# Patient Record
Sex: Male | Born: 1965 | Race: White | Hispanic: No | Marital: Married | Smoking: Never smoker
Health system: Southern US, Community
[De-identification: ages and names within clinical notes are randomized; demographics above are authoritative.]

## PROBLEM LIST (undated history)

## (undated) DIAGNOSIS — I10 Essential (primary) hypertension: Secondary | ICD-10-CM

## (undated) DIAGNOSIS — E119 Type 2 diabetes mellitus without complications: Secondary | ICD-10-CM

## (undated) DIAGNOSIS — Z87442 Personal history of urinary calculi: Secondary | ICD-10-CM

## (undated) DIAGNOSIS — J189 Pneumonia, unspecified organism: Secondary | ICD-10-CM

## (undated) DIAGNOSIS — K219 Gastro-esophageal reflux disease without esophagitis: Secondary | ICD-10-CM

## (undated) HISTORY — PX: TONSILLECTOMY: SUR1361

## (undated) HISTORY — PX: EYE SURGERY: SHX253

---

## 2008-08-29 ENCOUNTER — Ambulatory Visit: Payer: Self-pay | Admitting: Family Medicine

## 2010-03-12 IMAGING — US ABDOMEN ULTRASOUND
1 series · 17 of 25 positions shown · non-contrast
Comparison: none

REASON FOR EXAM: abnormal LFT   eval fatty liver
COMMENTS:

[Series 1: abdomen ultrasound · 17 of 67 slices shown]
[im 1/67]
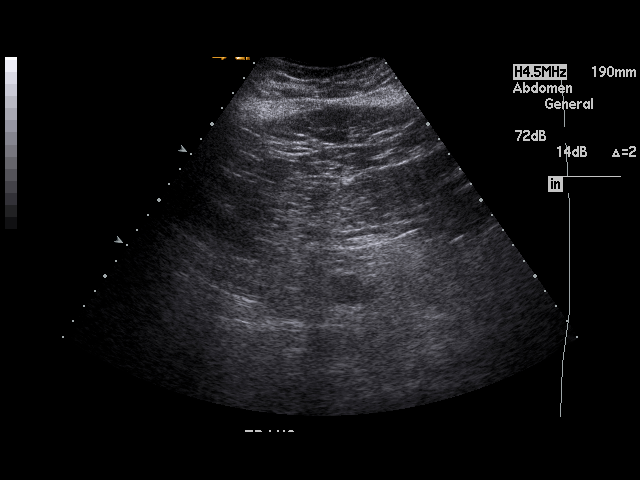
[im 6/67]
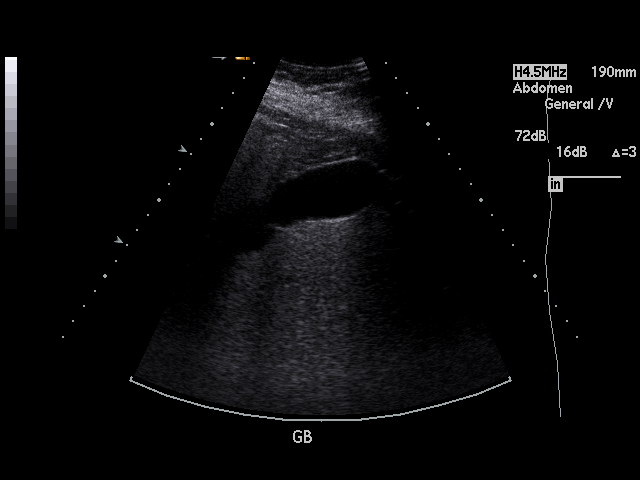
[im 9/67]
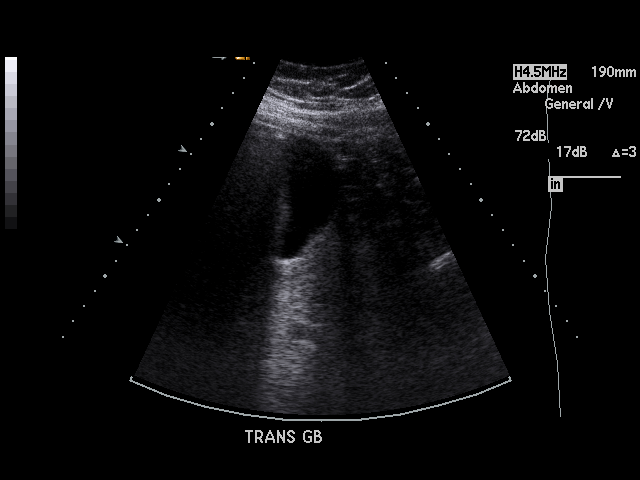
[im 14/67]
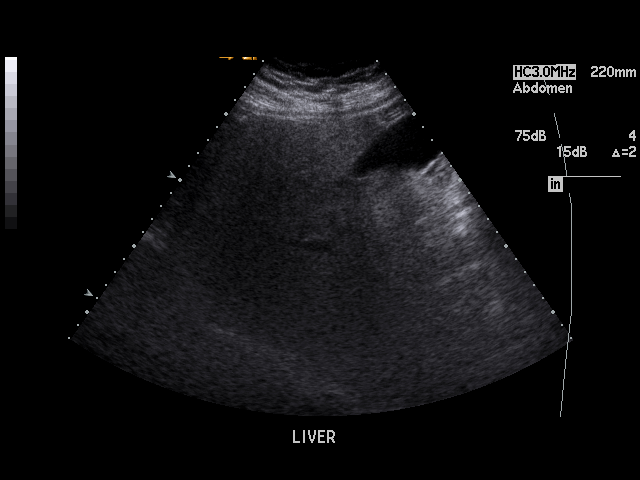
[im 17/67]
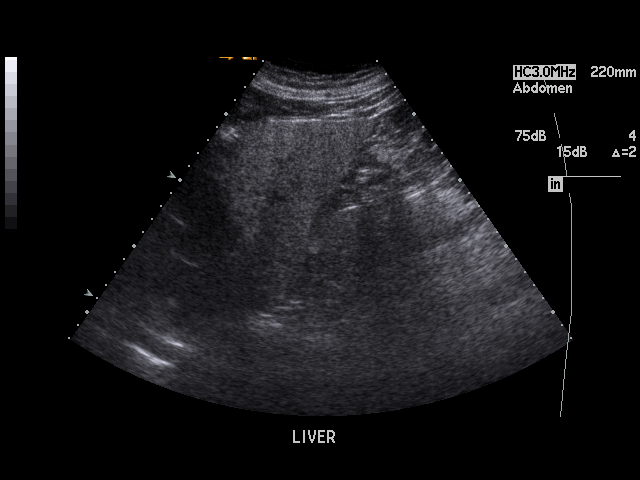
[im 23/67]
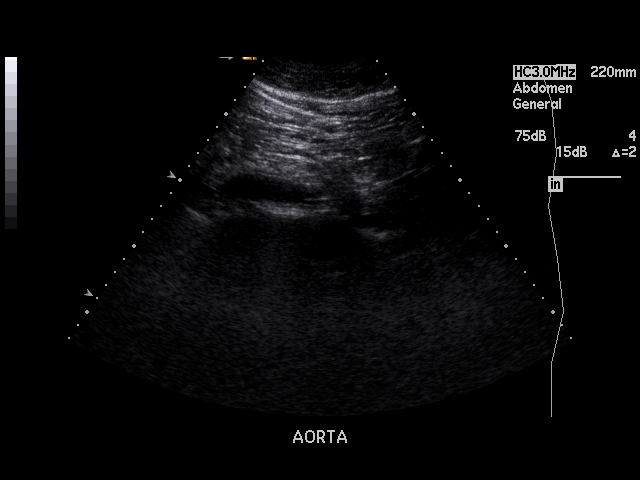
[im 25/67]
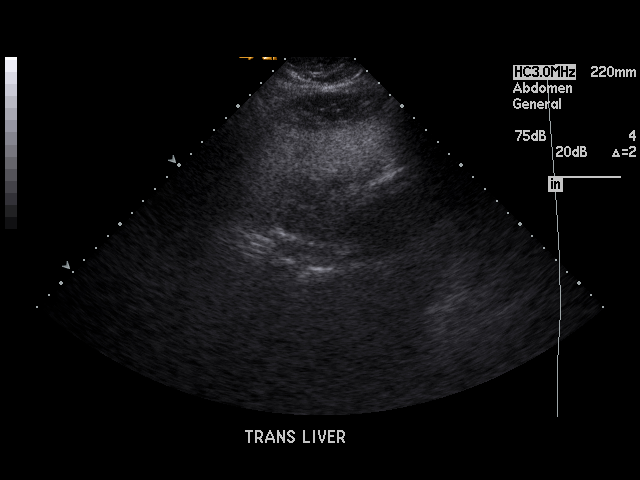
[im 31/67]
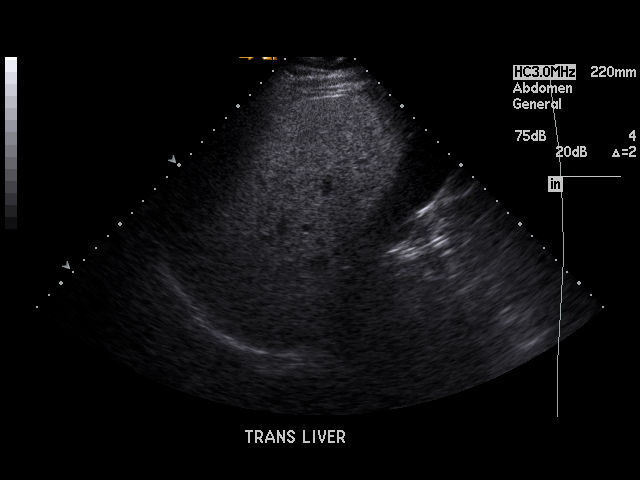
[im 34/67]
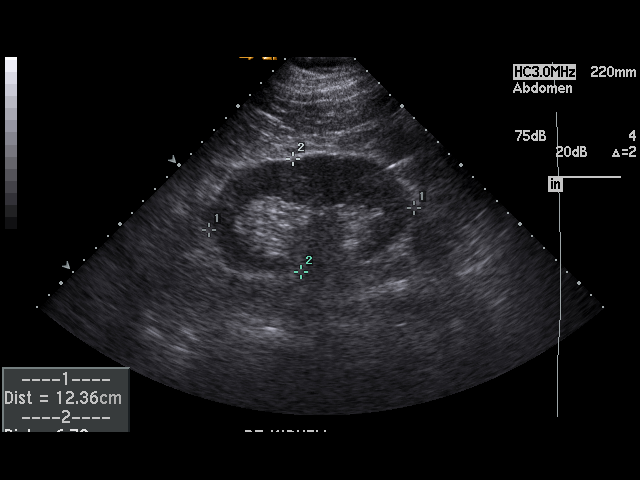
[im 36/67]
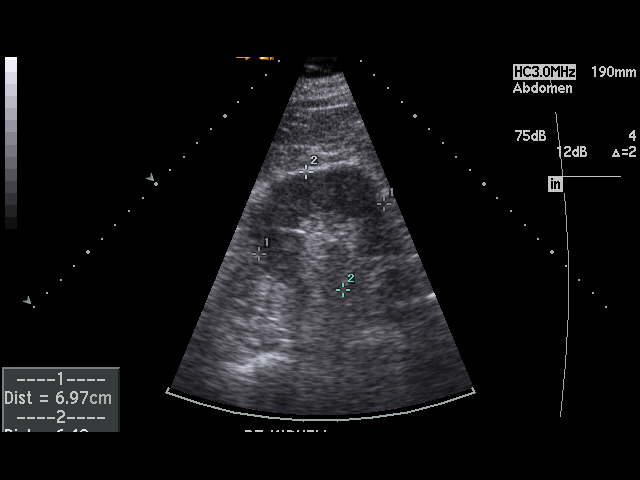
[im 42/67]
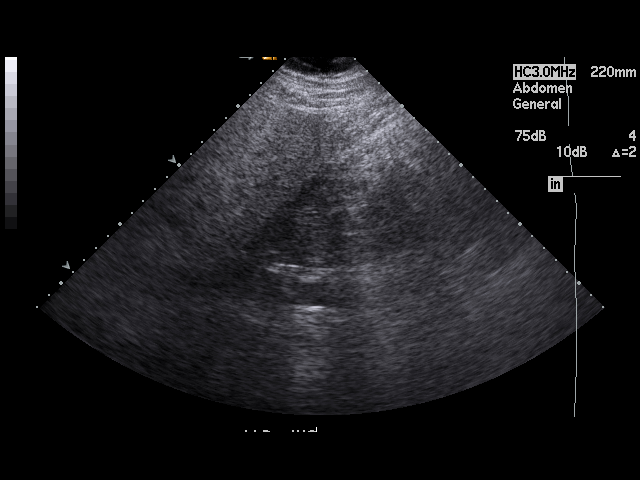
[im 45/67]
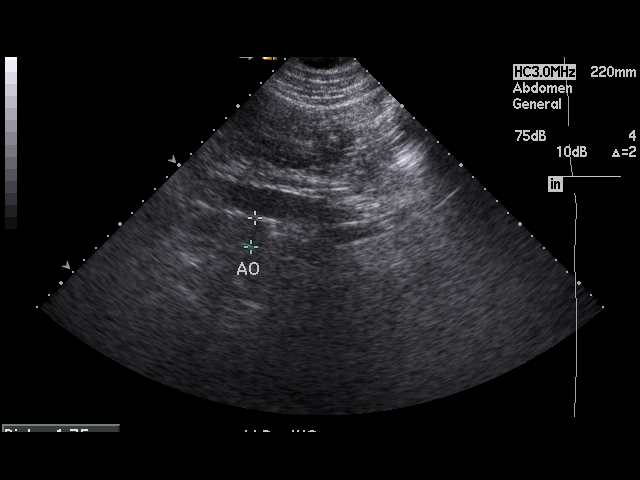
[im 50/67]
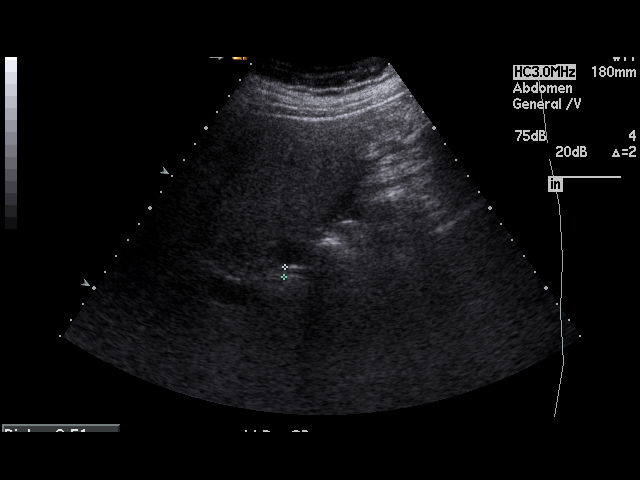
[im 53/67]
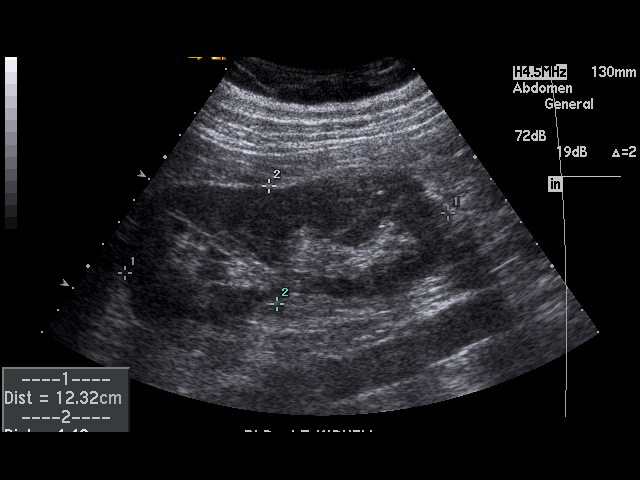
[im 58/67]
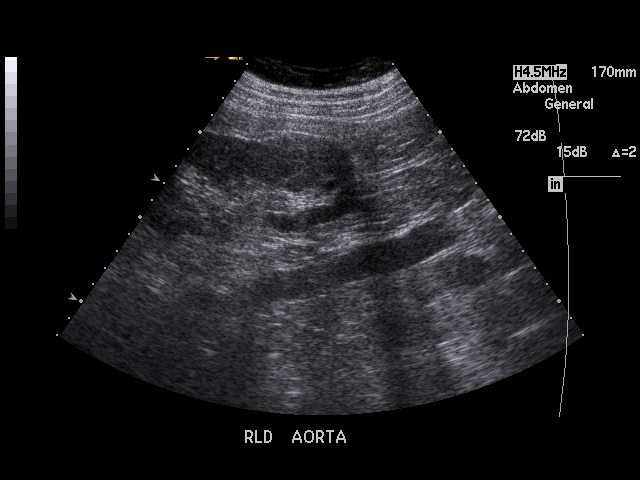
[im 61/67]
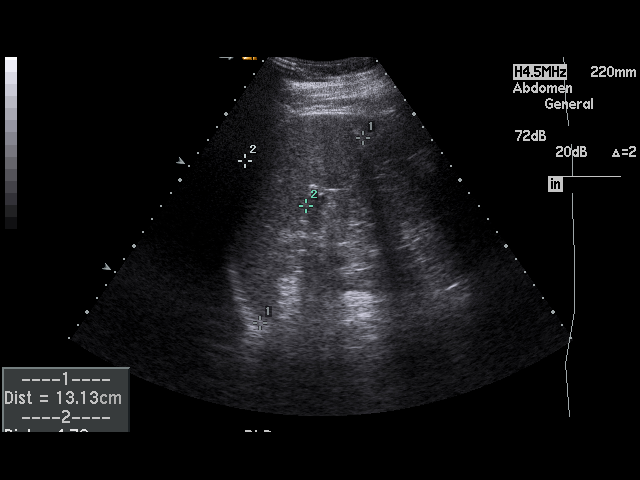
[im 67/67]
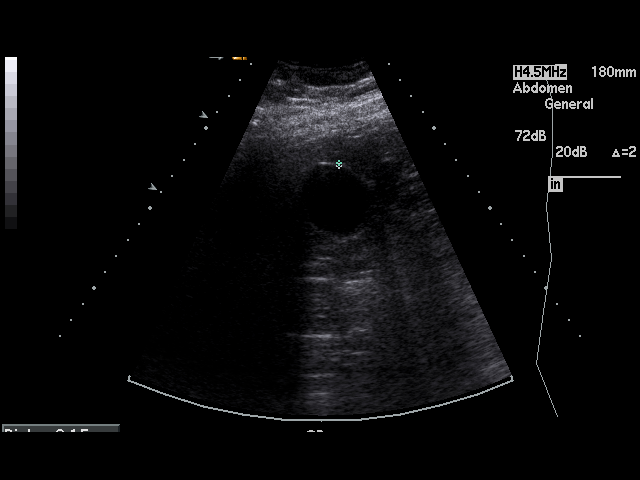

[17 of 25 positions shown; findings below may reference images not displayed]

PROCEDURE:     US  - US ABDOMEN GENERAL SURVEY  - August 29, 2008  [DATE]

RESULT:     The liver is dense, suspicious for fatty infiltration. No focal
hepatic mass lesions are seen. The spleen is upper limits to normal in size.
The spleen measures 13.1 cm at maximum AP diameter. The pancreas is not
visualized adequately for evaluation. The abdominal aorta and inferior vena
cava show no significant abnormalities. No gallstones are seen. There is no
thickening of the gallbladder wall. The common bile duct measures 5.1 mm in
diameter which is within normal limits. The kidneys show no hydronephrosis.
There is no ascites.
IMPRESSION: 1. No gallstones are seen.
2. Probable fatty infiltration of the liver.
3. The spleen is upper limits to normal in size.
4. The pancreas is not visualized adequate for evaluation.

## 2015-06-13 ENCOUNTER — Encounter: Payer: Self-pay | Admitting: *Deleted

## 2015-06-13 DIAGNOSIS — E669 Obesity, unspecified: Secondary | ICD-10-CM | POA: Diagnosis not present

## 2015-06-13 DIAGNOSIS — Z6837 Body mass index (BMI) 37.0-37.9, adult: Secondary | ICD-10-CM | POA: Diagnosis not present

## 2015-06-13 DIAGNOSIS — E119 Type 2 diabetes mellitus without complications: Secondary | ICD-10-CM | POA: Diagnosis not present

## 2015-06-13 DIAGNOSIS — H2511 Age-related nuclear cataract, right eye: Secondary | ICD-10-CM | POA: Diagnosis not present

## 2015-06-13 DIAGNOSIS — H269 Unspecified cataract: Secondary | ICD-10-CM | POA: Diagnosis present

## 2015-06-13 DIAGNOSIS — I10 Essential (primary) hypertension: Secondary | ICD-10-CM | POA: Diagnosis not present

## 2015-06-13 DIAGNOSIS — E780 Pure hypercholesterolemia: Secondary | ICD-10-CM | POA: Diagnosis not present

## 2015-06-19 ENCOUNTER — Ambulatory Visit: Payer: Managed Care, Other (non HMO) | Admitting: Certified Registered Nurse Anesthetist

## 2015-06-19 ENCOUNTER — Ambulatory Visit
Admission: RE | Admit: 2015-06-19 | Discharge: 2015-06-19 | Disposition: A | Discharge status: Home / Self Care | Payer: Managed Care, Other (non HMO) | Source: Ambulatory Visit | Attending: Ophthalmology | Admitting: Ophthalmology

## 2015-06-19 ENCOUNTER — Encounter
Admission: RE | Disposition: A | Discharge status: Home / Self Care | Payer: Self-pay | Source: Ambulatory Visit | Attending: Ophthalmology

## 2015-06-19 ENCOUNTER — Encounter: Payer: Self-pay | Admitting: *Deleted

## 2015-06-19 DIAGNOSIS — Z6837 Body mass index (BMI) 37.0-37.9, adult: Secondary | ICD-10-CM | POA: Insufficient documentation

## 2015-06-19 DIAGNOSIS — I10 Essential (primary) hypertension: Secondary | ICD-10-CM | POA: Insufficient documentation

## 2015-06-19 DIAGNOSIS — E780 Pure hypercholesterolemia: Secondary | ICD-10-CM | POA: Insufficient documentation

## 2015-06-19 DIAGNOSIS — E119 Type 2 diabetes mellitus without complications: Secondary | ICD-10-CM | POA: Insufficient documentation

## 2015-06-19 DIAGNOSIS — H2511 Age-related nuclear cataract, right eye: Secondary | ICD-10-CM | POA: Insufficient documentation

## 2015-06-19 DIAGNOSIS — E669 Obesity, unspecified: Secondary | ICD-10-CM | POA: Insufficient documentation

## 2015-06-19 HISTORY — DX: Essential (primary) hypertension: I10

## 2015-06-19 HISTORY — PX: CATARACT EXTRACTION W/PHACO: SHX586

## 2015-06-19 HISTORY — DX: Type 2 diabetes mellitus without complications: E11.9

## 2015-06-19 LAB — POTASSIUM: POTASSIUM: 3.5 mmol/L (ref 3.5–5.1)

## 2015-06-19 LAB — GLUCOSE, CAPILLARY: Glucose-Capillary: 203 mg/dL — ABNORMAL HIGH (ref 65–99)

## 2015-06-19 SURGERY — PHACOEMULSIFICATION, CATARACT, WITH IOL INSERTION
Anesthesia: Monitor Anesthesia Care | Site: Eye | Laterality: Right | Wound class: Clean

## 2015-06-19 MED ORDER — CYCLOPENTOLATE HCL 2 % OP SOLN
1.0000 [drp] | OPHTHALMIC | Status: AC | PRN
Start: 2015-06-19 — End: 2015-06-19
  Administered 2015-06-19 (×4): 1 [drp] via OPHTHALMIC

## 2015-06-19 MED ORDER — MOXIFLOXACIN HCL 0.5 % OP SOLN
OPHTHALMIC | Status: DC | PRN
Start: 2015-06-19 — End: 2015-06-19
  Administered 2015-06-19: 1 [drp] via OPHTHALMIC

## 2015-06-19 MED ORDER — CARBACHOL 0.01 % IO SOLN
INTRAOCULAR | Status: DC | PRN
  Administered 2015-06-19: 0.5 mL via INTRAOCULAR

## 2015-06-19 MED ORDER — MIDAZOLAM HCL 2 MG/2ML IJ SOLN
INTRAMUSCULAR | Status: DC | PRN
  Administered 2015-06-19 (×2): 1 mg via INTRAVENOUS

## 2015-06-19 MED ORDER — CEFUROXIME OPHTHALMIC INJECTION 1 MG/0.1 ML
INJECTION | OPHTHALMIC | Status: DC | PRN
  Administered 2015-06-19: 0.1 mL via INTRACAMERAL

## 2015-06-19 MED ORDER — NA CHONDROIT SULF-NA HYALURON 40-17 MG/ML IO SOLN
INTRAOCULAR | Status: DC | PRN
Start: 2015-06-19 — End: 2015-06-19
  Administered 2015-06-19: 1 mL via INTRAOCULAR

## 2015-06-19 MED ORDER — TETRACAINE HCL 0.5 % OP SOLN
OPHTHALMIC | Status: DC | PRN
  Administered 2015-06-19: 2 [drp] via OPHTHALMIC

## 2015-06-19 MED ORDER — PHENYLEPHRINE HCL 10 % OP SOLN
1.0000 [drp] | OPHTHALMIC | Status: AC | PRN
  Administered 2015-06-19 (×4): 1 [drp] via OPHTHALMIC

## 2015-06-19 MED ORDER — MOXIFLOXACIN HCL 0.5 % OP SOLN
1.0000 [drp] | OPHTHALMIC | Status: AC | PRN
  Administered 2015-06-19 (×3): 1 [drp] via OPHTHALMIC

## 2015-06-19 MED ORDER — SODIUM CHLORIDE 0.9 % IV SOLN
INTRAVENOUS | Status: DC
Start: 2015-06-19 — End: 2015-06-19
  Administered 2015-06-19: 06:00:00 via INTRAVENOUS

## 2015-06-19 MED ORDER — ALFENTANIL 500 MCG/ML IJ INJ
INJECTION | INTRAMUSCULAR | Status: DC | PRN
  Administered 2015-06-19 (×2): 250 ug via INTRAVENOUS

## 2015-06-19 MED ORDER — EPINEPHRINE HCL 1 MG/ML IJ SOLN
INTRAMUSCULAR | Status: DC | PRN
  Administered 2015-06-19: 200 mL via OPHTHALMIC

## 2015-06-19 MED ORDER — LIDOCAINE HCL (PF) 4 % IJ SOLN
INTRAMUSCULAR | Status: DC | PRN
  Administered 2015-06-19: 4 mL via OPHTHALMIC

## 2015-06-19 MED ORDER — EPINEPHRINE HCL 1 MG/ML IJ SOLN
INTRAMUSCULAR | Status: DC | PRN
  Administered 2015-06-19: 4 mL via OPHTHALMIC

## 2015-06-19 SURGICAL SUPPLY — 32 items
ACRYsofIQ ×3 IMPLANT
CANNULA ANT/CHMB 27GA (MISCELLANEOUS) ×3 IMPLANT
CORD BIP STRL DISP 12FT (MISCELLANEOUS) ×3 IMPLANT
CUP MEDICINE 2OZ PLAST GRAD ST (MISCELLANEOUS) ×3 IMPLANT
DRAPE XRAY CASSETTE 23X24 (DRAPES) ×3 IMPLANT
ERASER HMR WETFIELD 18G (MISCELLANEOUS) ×3 IMPLANT
GLOVE BIO SURGEON STRL SZ8 (GLOVE) ×3 IMPLANT
GLOVE SURG LX 6.5 MICRO (GLOVE) ×2
GLOVE SURG LX 8.0 MICRO (GLOVE) ×2
GLOVE SURG LX STRL 6.5 MICRO (GLOVE) ×1 IMPLANT
GLOVE SURG LX STRL 8.0 MICRO (GLOVE) ×1 IMPLANT
GOWN STRL REUS W/ TWL LRG LVL3 (GOWN DISPOSABLE) ×1 IMPLANT
GOWN STRL REUS W/ TWL XL LVL3 (GOWN DISPOSABLE) ×1 IMPLANT
GOWN STRL REUS W/TWL LRG LVL3 (GOWN DISPOSABLE) ×2
GOWN STRL REUS W/TWL XL LVL3 (GOWN DISPOSABLE) ×2
LENS IOL ACRSF IQ TRC 5 18.5 (Intraocular Lens) ×1 IMPLANT
LENS IOL ACRYSOF IQ TORIC 18.5 (Intraocular Lens) ×2 IMPLANT
LENS IOL IQ TORIC 5 18.5 (Intraocular Lens) ×1 IMPLANT
PACK CATARACT (MISCELLANEOUS) ×3 IMPLANT
PACK CATARACT DINGLEDEIN LX (MISCELLANEOUS) ×3 IMPLANT
PACK EYE AFTER SURG (MISCELLANEOUS) ×3 IMPLANT
SHLD EYE VISITEC  UNIV (MISCELLANEOUS) ×3 IMPLANT
SOL BSS BAG (MISCELLANEOUS) ×3
SOL PREP PVP 2OZ (MISCELLANEOUS) ×3
SOLUTION BSS BAG (MISCELLANEOUS) ×1 IMPLANT
SOLUTION PREP PVP 2OZ (MISCELLANEOUS) ×1 IMPLANT
SUT SILK 5-0 (SUTURE) ×3 IMPLANT
SYR 3ML LL SCALE MARK (SYRINGE) ×3 IMPLANT
SYR 5ML LL (SYRINGE) ×3 IMPLANT
SYR TB 1ML 27GX1/2 LL (SYRINGE) ×3 IMPLANT
WATER STERILE IRR 1000ML POUR (IV SOLUTION) ×3 IMPLANT
WIPE NON LINTING 3.25X3.25 (MISCELLANEOUS) ×3 IMPLANT

## 2015-06-19 NOTE — Anesthesia Procedure Notes (Signed)
Procedure Name: MAC Performed by: Kamden Stanislaw Pre-anesthesia Checklist: Patient identified, Emergency Drugs available, Suction available, Patient being monitored and Timeout performed Oxygen Delivery Method: Nasal cannula       

## 2015-06-19 NOTE — Anesthesia Preprocedure Evaluation (Signed)
Anesthesia Evaluation  Patient identified by MRN, date of birth, ID band Patient awake    Reviewed: Allergy & Precautions, NPO status , Patient's Chart, lab work & pertinent test results  Airway Mallampati: II  TM Distance: >3 FB Neck ROM: Full    Dental  (+) Teeth Intact   Pulmonary    Pulmonary exam normal       Cardiovascular Exercise Tolerance: Good hypertension, Pt. on medications Normal cardiovascular exam    Neuro/Psych    GI/Hepatic   Endo/Other  diabetes, Poorly Controlled, Type 2  Renal/GU      Musculoskeletal   Abdominal (+) + obese,   Peds  Hematology   Anesthesia Other Findings   Reproductive/Obstetrics                             Anesthesia Physical Anesthesia Plan  ASA: III  Anesthesia Plan: MAC   Post-op Pain Management:    Induction: Intravenous  Airway Management Planned: Nasal Cannula  Additional Equipment:   Intra-op Plan:   Post-operative Plan:   Informed Consent: I have reviewed the patients History and Physical, chart, labs and discussed the procedure including the risks, benefits and alternatives for the proposed anesthesia with the patient or authorized representative who has indicated his/her understanding and acceptance.     Plan Discussed with: CRNA  Anesthesia Plan Comments:         Anesthesia Quick Evaluation

## 2015-06-19 NOTE — Discharge Instructions (Signed)
AMBULATORY SURGERY  DISCHARGE INSTRUCTIONS   1) The drugs that you were given will stay in your system until tomorrow so for the next 24 hours you should not:  A) Drive an automobile B) Make any legal decisions C) Drink any alcoholic beverage   2) You may resume regular meals tomorrow.  Today it is better to start with liquids and gradually work up to solid foods.  You may eat anything you prefer, but it is better to start with liquids, then soup and crackers, and gradually work up to solid foods.   3) Please notify your doctor immediately if you have any unusual bleeding, trouble breathing, redness and pain at the surgery site, drainage, fever, or pain not relieved by medication.    4) Additional Instructions:        Please contact your physician with any problems or Same Day Surgery at [phone], Monday through Friday 6 am to 4 pm, or [place] at [facility] number at [phone]. Eye Surgery Discharge Instructions  Expect mild scratchy sensation or mild soreness. DO NOT RUB YOUR EYE!  The day of surgery:  Minimal physical activity, but bed rest is not required  No reading, computer work, or close hand work  No bending, lifting, or straining.  May watch TV  For 24 hours:  No driving, legal decisions, or alcoholic beverages  Safety precautions  Eat anything you prefer: It is better to start with liquids, then soup then solid foods.  _____ Eye patch should be worn until postoperative exam tomorrow.  ____ Solar shield eyeglasses should be worn for comfort in the sunlight/patch while sleeping  Resume all regular medications including aspirin or Coumadin if these were discontinued prior to surgery. You may shower, bathe, shave, or wash your hair. Tylenol may be taken for mild discomfort.  Call your doctor if you experience significant pain, nausea, or vomiting, fever > 101 or other signs of infection. [phone] or [phone] Specific  instructions:  Follow-up Information    Follow up with Tilden Broz, MD In 1 day.   Specialty:  Ophthalmology   Why:  August 30 at 9:45am   Contact information:   [address]   [place] [address] [phone]

## 2015-06-19 NOTE — Interval H&P Note (Signed)
History and Physical Interval Note:  06/19/2015 7:23 AM  Kenneth Erickson  has presented today for surgery, with the diagnosis of CATARACT  The various methods of treatment have been discussed with the patient and family. After consideration of risks, benefits and other options for treatment, the patient has consented to  Procedure(s): CATARACT EXTRACTION PHACO AND INTRAOCULAR LENS PLACEMENT (IOC) (Right) as a surgical intervention .  The patient's history has been reviewed, patient examined, no change in status, stable for surgery.  I have reviewed the patient's chart and labs.  Questions were answered to the patient's satisfaction.     Jasani Lengel

## 2015-06-19 NOTE — Progress Notes (Signed)
K+ 3.5 - sent to lab as poct machine inoperable Pt to OR with clothing, shoes, wedding band on IV right hand - p ox right mid finger ecg leads (new) and shoe covers in place

## 2015-06-19 NOTE — Op Note (Signed)
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 Item Serial No. Manufacturer Lot No. LRB No. Used  [id number]     [id number]     Right 1   Anesthesia: 2% Lidocaine and 4% Marcaine in a 50/50 mixture with 10 unites/ml of Hylenex given as a peribulbar Anesthesiologist: Anesthesiologist: Linward Natal, MD CRNA: Malva Cogan, CRNA Complications: none Estimated Blood Loss: less than 1 ml  Description of procedure:  The patient sat upright and the eyes were anesthetized with topical proparacaine. With the patient fixing at a distant target the 3 o'clock and 9 o'clock positions at the limbus were marked with an sterile indelible surgical marking pen.  The patient was given anesthesia and sedation via intravenous access. The patient was then prepped and draped in the usual fashion. A 25-gauge needle was bent to use for starting the capsulorhexis. A 5-0 silk suture was placed through the conjunctiva superior and inferiorly to serve as bridle sutures.   The Verion system was engaged and registration was performed. The positions of the incisions and the steep axis of the astigmatism were identified by the Verion system and marked with an indelible pen. The Verion heads up display was turned off.  Hemostasis was obtained at the the position of the main incision using an eraser cautery. A partial thickness groove was made at the at that location with a 64 Beaver blade and this was dissected anteriorly with an SYSCO. The anterior chamber was entered at 10 o'clock with a 1.0 mm paracentesis knife and through the lamellar dissection with a 2.6 mm Alcon keratome. Epi-Shugarcaine 0.5 CC [9 cc BSS Plus (Alcon),  3 cc 4% preservative-free lidocaine (Hospira) and 4 cc 1:1000 preservative-free, bisulfite-free epinephrine] was injected via the paracentesis tract.  DiscoVisc was injected to replace the aqueous and a continuous tear curvilinear capsulorhexis was performed using a bent 25-gauge needle.  Balance salt on a syringe was used to perform hydro-dissection and phacoemulsification was carried out using a divide and conquer technique. The lens was very soft so the center was de bulked with the phaco unit. Total ultrasound time was 0:12. The average ultrasonic power was 23.7. The CDE was 7.13.  Irrigation/aspiration was used to remove the residual nucleas and cortex and the capsular bag was inflated with DiscoVisc. The intraocular lens was inserted into the capsular bag using a Monarch Delivery System cartridge. The Verion heads up display was turned on and the lens was rotated so that the marks on the base of the haptics were aligned with the steep axis of astigmatism as identified by the Verion unit.   Irrigation/aspiration was used to remove the residual DiscoVisc. The I/A hand piece was pressed down on top of the lens to prevent rotation. The wound was inflated with balanced salt and checked for leaks. None were found. The position of the Toric lens was reconfirmed using the Verion unit. Miostat was injected via the paracentesis track and 0.1 ml of cefuroxime containing 1 mg of drug was injected via the paracentesis track. The wound was checked for leaks again and none were found.   The bridal sutures were removed and two drops of Vigamox were placed on the eye. An eye shield was placed to protect the eye and the patient was discharged to the  recovery area in good condition.   Arland Usery MD @T @

## 2015-06-19 NOTE — Anesthesia Postprocedure Evaluation (Signed)
  Anesthesia Post-op Note  Patient: Kenneth Erickson  Procedure(s) Performed: Procedure(s) with comments: CATARACT EXTRACTION PHACO AND INTRAOCULAR LENS PLACEMENT (IOC) (Right) - [place]: 00:12.0 AP%: 23.7 CDE: 7.13 Lot # [id number]  Anesthesia type:MAC  Patient location: PACU  Post pain: Pain level controlled  Post assessment: Post-op Vital signs reviewed, Patient's Cardiovascular Status Stable, Respiratory Function Stable, Patent Airway and No signs of Nausea or vomiting  Post vital signs: Reviewed and stable  Last Vitals:  Filed Vitals:   06/19/15 0816  BP: 137/89  Pulse: 80  Temp: 36.3 C  Resp:     Level of consciousness: awake, alert  and patient cooperative  Complications: No apparent anesthesia complications

## 2015-06-19 NOTE — H&P (Signed)
  See scanned notes. 

## 2015-06-19 NOTE — Transfer of Care (Signed)
Immediate Anesthesia Transfer of Care Note  Patient: Kenneth Erickson  Procedure(s) Performed: Procedure(s) with comments: CATARACT EXTRACTION PHACO AND INTRAOCULAR LENS PLACEMENT (IOC) (Right) - [place]: 00:12.0 AP%: 23.7 CDE: 7.13 Lot # [id number]  Patient Location: PACU  Anesthesia Type:MAC  Level of Consciousness: awake, alert  and oriented  Airway & Oxygen Therapy: Patient Spontanous Breathing  Post-op Assessment: Report given to RN and Post -op Vital signs reviewed and stable  Post vital signs: Reviewed and stable  Last Vitals:  Filed Vitals:   06/19/15 0816  BP: 137/89  Pulse: 80  Temp: 36.3 C  Resp:     Complications: No apparent anesthesia complications

## 2015-06-20 ENCOUNTER — Encounter: Payer: Self-pay | Admitting: Ophthalmology

## 2015-09-11 ENCOUNTER — Encounter
Admission: RE | Disposition: A | Discharge status: Home / Self Care | Payer: Self-pay | Source: Ambulatory Visit | Attending: Ophthalmology

## 2015-09-11 ENCOUNTER — Encounter: Payer: Self-pay | Admitting: *Deleted

## 2015-09-11 ENCOUNTER — Ambulatory Visit: Payer: Managed Care, Other (non HMO) | Admitting: Anesthesiology

## 2015-09-11 ENCOUNTER — Ambulatory Visit
Admission: RE | Admit: 2015-09-11 | Discharge: 2015-09-11 | Disposition: A | Discharge status: Home / Self Care | Payer: Managed Care, Other (non HMO) | Source: Ambulatory Visit | Attending: Ophthalmology | Admitting: Ophthalmology

## 2015-09-11 DIAGNOSIS — Z9841 Cataract extraction status, right eye: Secondary | ICD-10-CM | POA: Diagnosis not present

## 2015-09-11 DIAGNOSIS — E7800 Pure hypercholesterolemia, unspecified: Secondary | ICD-10-CM | POA: Diagnosis not present

## 2015-09-11 DIAGNOSIS — H25012 Cortical age-related cataract, left eye: Secondary | ICD-10-CM | POA: Diagnosis not present

## 2015-09-11 DIAGNOSIS — I10 Essential (primary) hypertension: Secondary | ICD-10-CM | POA: Diagnosis not present

## 2015-09-11 DIAGNOSIS — H2512 Age-related nuclear cataract, left eye: Secondary | ICD-10-CM | POA: Insufficient documentation

## 2015-09-11 DIAGNOSIS — H269 Unspecified cataract: Secondary | ICD-10-CM | POA: Diagnosis present

## 2015-09-11 DIAGNOSIS — Z8711 Personal history of peptic ulcer disease: Secondary | ICD-10-CM | POA: Diagnosis not present

## 2015-09-11 DIAGNOSIS — E119 Type 2 diabetes mellitus without complications: Secondary | ICD-10-CM | POA: Diagnosis not present

## 2015-09-11 HISTORY — PX: CATARACT EXTRACTION W/PHACO: SHX586

## 2015-09-11 LAB — GLUCOSE, CAPILLARY: GLUCOSE-CAPILLARY: 248 mg/dL — AB (ref 65–99)

## 2015-09-11 SURGERY — PHACOEMULSIFICATION, CATARACT, WITH IOL INSERTION
Anesthesia: Monitor Anesthesia Care | Site: Eye | Laterality: Left | Wound class: Clean

## 2015-09-11 MED ORDER — TETRACAINE HCL 0.5 % OP SOLN
OPHTHALMIC | Status: AC
Start: 2015-09-11 — End: 2015-09-11
  Filled 2015-09-11: qty 2

## 2015-09-11 MED ORDER — PHENYLEPHRINE HCL 10 % OP SOLN
OPHTHALMIC | Status: AC
  Administered 2015-09-11: 1 [drp] via OPHTHALMIC
  Filled 2015-09-11: qty 5

## 2015-09-11 MED ORDER — MOXIFLOXACIN HCL 0.5 % OP SOLN
OPHTHALMIC | Status: DC | PRN
  Administered 2015-09-11: 1 [drp] via OPHTHALMIC

## 2015-09-11 MED ORDER — EPINEPHRINE HCL 1 MG/ML IJ SOLN
INTRAMUSCULAR | Status: AC
  Filled 2015-09-11: qty 1

## 2015-09-11 MED ORDER — SODIUM CHLORIDE 0.9 % IV SOLN
INTRAVENOUS | Status: DC
  Administered 2015-09-11: 07:00:00 via INTRAVENOUS

## 2015-09-11 MED ORDER — HYALURONIDASE HUMAN 150 UNIT/ML IJ SOLN
INTRAMUSCULAR | Status: AC
  Filled 2015-09-11: qty 1

## 2015-09-11 MED ORDER — PROPARACAINE HCL 0.5 % OP SOLN
OPHTHALMIC | Status: AC
  Filled 2015-09-11: qty 15

## 2015-09-11 MED ORDER — CEFUROXIME OPHTHALMIC INJECTION 1 MG/0.1 ML
INJECTION | OPHTHALMIC | Status: DC | PRN
  Administered 2015-09-11: .1 mL via INTRACAMERAL

## 2015-09-11 MED ORDER — MOXIFLOXACIN HCL 0.5 % OP SOLN
OPHTHALMIC | Status: AC
  Administered 2015-09-11: 1 [drp] via OPHTHALMIC
  Filled 2015-09-11: qty 3

## 2015-09-11 MED ORDER — LIDOCAINE HCL (PF) 4 % IJ SOLN
INTRAMUSCULAR | Status: DC | PRN
  Administered 2015-09-11: 1 mL via OPHTHALMIC

## 2015-09-11 MED ORDER — ONDANSETRON HCL 4 MG/2ML IJ SOLN
INTRAMUSCULAR | Status: DC | PRN
  Administered 2015-09-11: 4 mg via INTRAVENOUS

## 2015-09-11 MED ORDER — ALFENTANIL 500 MCG/ML IJ INJ
INJECTION | INTRAMUSCULAR | Status: DC | PRN
  Administered 2015-09-11: 500 ug via INTRAVENOUS

## 2015-09-11 MED ORDER — PHENYLEPHRINE HCL 10 % OP SOLN
1.0000 [drp] | OPHTHALMIC | Status: AC | PRN
  Administered 2015-09-11 (×4): 1 [drp] via OPHTHALMIC

## 2015-09-11 MED ORDER — NA CHONDROIT SULF-NA HYALURON 40-17 MG/ML IO SOLN
INTRAOCULAR | Status: AC
  Filled 2015-09-11: qty 1

## 2015-09-11 MED ORDER — CARBACHOL 0.01 % IO SOLN
INTRAOCULAR | Status: DC | PRN
  Administered 2015-09-11: .5 mL via INTRAOCULAR

## 2015-09-11 MED ORDER — BUPIVACAINE HCL (PF) 0.75 % IJ SOLN
INTRAMUSCULAR | Status: AC
  Filled 2015-09-11: qty 10

## 2015-09-11 MED ORDER — EPINEPHRINE HCL 1 MG/ML IJ SOLN
INTRAOCULAR | Status: DC | PRN
  Administered 2015-09-11: 1 mL via OPHTHALMIC

## 2015-09-11 MED ORDER — MIDAZOLAM HCL 2 MG/2ML IJ SOLN
INTRAMUSCULAR | Status: DC | PRN
  Administered 2015-09-11: 2 mg via INTRAVENOUS

## 2015-09-11 MED ORDER — MOXIFLOXACIN HCL 0.5 % OP SOLN
1.0000 [drp] | OPHTHALMIC | Status: AC | PRN
  Administered 2015-09-11 (×3): 1 [drp] via OPHTHALMIC

## 2015-09-11 MED ORDER — LIDOCAINE HCL (PF) 4 % IJ SOLN
INTRAMUSCULAR | Status: AC
  Filled 2015-09-11: qty 5

## 2015-09-11 MED ORDER — CYCLOPENTOLATE HCL 2 % OP SOLN
OPHTHALMIC | Status: AC
  Administered 2015-09-11: 1 [drp] via OPHTHALMIC
  Filled 2015-09-11: qty 2

## 2015-09-11 MED ORDER — TETRACAINE HCL 0.5 % OP SOLN
OPHTHALMIC | Status: DC | PRN
  Administered 2015-09-11: 1 [drp] via OPHTHALMIC

## 2015-09-11 MED ORDER — NA CHONDROIT SULF-NA HYALURON 40-17 MG/ML IO SOLN
INTRAOCULAR | Status: DC | PRN
  Administered 2015-09-11: 1 mL via INTRAOCULAR

## 2015-09-11 MED ORDER — CYCLOPENTOLATE HCL 2 % OP SOLN
1.0000 [drp] | OPHTHALMIC | Status: AC | PRN
  Administered 2015-09-11 (×4): 1 [drp] via OPHTHALMIC

## 2015-09-11 MED ORDER — PROPARACAINE HCL 0.5 % OP SOLN
OPHTHALMIC | Status: DC | PRN
  Administered 2015-09-11: 1 [drp] via OPHTHALMIC

## 2015-09-11 MED ORDER — LIDOCAINE HCL (PF) 4 % IJ SOLN
INTRAMUSCULAR | Status: DC | PRN
  Administered 2015-09-11: 4 mL via OPHTHALMIC

## 2015-09-11 SURGICAL SUPPLY — 34 items
CANNULA ANT/CHMB 27GA (MISCELLANEOUS) ×2 IMPLANT
CORD BIP STRL DISP 12FT (MISCELLANEOUS) ×2 IMPLANT
CUP MEDICINE 2OZ PLAST GRAD ST (MISCELLANEOUS) ×2 IMPLANT
DRAPE XRAY CASSETTE 23X24 (DRAPES) ×2 IMPLANT
ERASER HMR WETFIELD 18G (MISCELLANEOUS) ×2 IMPLANT
GLOVE BIO SURGEON STRL SZ8 (GLOVE) ×2 IMPLANT
GLOVE SURG LX 6.5 MICRO (GLOVE) ×1
GLOVE SURG LX 8.0 MICRO (GLOVE) ×1
GLOVE SURG LX STRL 6.5 MICRO (GLOVE) ×1 IMPLANT
GLOVE SURG LX STRL 8.0 MICRO (GLOVE) ×1 IMPLANT
GOWN STRL REUS W/ TWL LRG LVL3 (GOWN DISPOSABLE) ×1 IMPLANT
GOWN STRL REUS W/ TWL XL LVL3 (GOWN DISPOSABLE) ×1 IMPLANT
GOWN STRL REUS W/TWL LRG LVL3 (GOWN DISPOSABLE) ×1
GOWN STRL REUS W/TWL XL LVL3 (GOWN DISPOSABLE) ×1
KIT IRRIGAT 0.9 MICROSMOOTH (MISCELLANEOUS) ×2 IMPLANT
LENS IOL ACRSF IQ TRC 3 18.0 (Intraocular Lens) ×1 IMPLANT
LENS IOL ACRSF IQ ULTRA 18.0 (Intraocular Lens) IMPLANT
LENS IOL ACRYSOF IQ 18.0 (Intraocular Lens) IMPLANT
LENS IOL ACRYSOF IQ TORIC 18.0 (Intraocular Lens) ×1 IMPLANT
LENS IOL IQ TORIC 3 18.0 (Intraocular Lens) ×1 IMPLANT
PACK CATARACT (MISCELLANEOUS) ×2 IMPLANT
PACK CATARACT DINGLEDEIN LX (MISCELLANEOUS) ×2 IMPLANT
PACK EYE AFTER SURG (MISCELLANEOUS) ×2 IMPLANT
SHLD EYE VISITEC  UNIV (MISCELLANEOUS) ×2 IMPLANT
SOL BSS BAG (MISCELLANEOUS) ×2
SOL PREP PVP 2OZ (MISCELLANEOUS) ×2
SOLUTION BSS BAG (MISCELLANEOUS) ×1 IMPLANT
SOLUTION PREP PVP 2OZ (MISCELLANEOUS) ×1 IMPLANT
SUT SILK 5-0 (SUTURE) ×2 IMPLANT
SYR 3ML LL SCALE MARK (SYRINGE) ×2 IMPLANT
SYR 5ML LL (SYRINGE) ×2 IMPLANT
SYR TB 1ML 27GX1/2 LL (SYRINGE) ×2 IMPLANT
WATER STERILE IRR 1000ML POUR (IV SOLUTION) ×2 IMPLANT
WIPE NON LINTING 3.25X3.25 (MISCELLANEOUS) ×2 IMPLANT

## 2015-09-11 NOTE — Anesthesia Procedure Notes (Signed)
Procedure Name: MAC Date/Time: 09/11/2015 7:36 AM Performed by: Stormy FabianURTIS, Kenneth Webb Pre-anesthesia Checklist: Patient identified, Emergency Drugs available, Suction available and Patient being monitored Patient Re-evaluated:Patient Re-evaluated prior to inductionOxygen Delivery Method: Nasal cannula Dental Injury: Teeth and Oropharynx as per pre-operative assessment  Comments: Nasal cannula with etCO2 monitoring

## 2015-09-11 NOTE — Anesthesia Preprocedure Evaluation (Signed)
Anesthesia Evaluation  Patient identified by MRN, date of birth, ID band Patient awake    Reviewed: Allergy & Precautions, NPO status   Airway Mallampati: III       Dental no notable dental hx.    Pulmonary neg pulmonary ROS,    Pulmonary exam normal        Cardiovascular Exercise Tolerance: Good hypertension, Pt. on medications  Rhythm:Regular Rate:Normal     Neuro/Psych    GI/Hepatic negative GI ROS, Neg liver ROS,   Endo/Other  diabetes, Well Controlled, Type 2, Oral Hypoglycemic Agents  Renal/GU negative Renal ROS     Musculoskeletal   Abdominal Normal abdominal exam  (+)   Peds  Hematology negative hematology ROS (+)   Anesthesia Other Findings   Reproductive/Obstetrics                             Anesthesia Physical Anesthesia Plan  ASA: II  Anesthesia Plan: MAC   Post-op Pain Management:    Induction: Intravenous  Airway Management Planned: Nasal Cannula  Additional Equipment:   Intra-op Plan:   Post-operative Plan:   Informed Consent: I have reviewed the patients History and Physical, chart, labs and discussed the procedure including the risks, benefits and alternatives for the proposed anesthesia with the patient or authorized representative who has indicated his/her understanding and acceptance.     Plan Discussed with: CRNA  Anesthesia Plan Comments:         Anesthesia Quick Evaluation

## 2015-09-11 NOTE — Anesthesia Postprocedure Evaluation (Signed)
Anesthesia Post Note  Patient: Kenneth Erickson  Procedure(s) Performed: Procedure(s) (LRB): CATARACT EXTRACTION PHACO AND INTRAOCULAR LENS PLACEMENT (IOC) (Left)  Anesthesia type: MAC  Patient location: Phase II  Post pain: Pain level controlled  Post assessment: Post-op Vital signs reviewed  Last Vitals:  Filed Vitals:   09/11/15 0821 09/11/15 0824  BP: 118/69 120/68  Pulse: 89 85  Temp: 36.2 C   Resp: 16 16    Post vital signs: stable  Level of consciousness: Patient remains intubated per anesthesia plan  Complications: No apparent anesthesia complications

## 2015-09-11 NOTE — Op Note (Signed)
Date of Surgery: 09/11/2015 Date of Dictation: 09/11/2015 8:19 AM Pre-operative Diagnosis:Nuclear Sclerotic Cataract and Cortical Cataract left Eye Post-operative Diagnosis: same Procedure performed: Extra-capsular Cataract Extraction (ECCE) with placement of a posterior chamber intraocular lens (IOL) left Eye IOL:  Implant Name Type Inv. Item Serial No. Manufacturer Lot No. LRB No. Used  acrysof toric lens     [id number]     Left 1   Anesthesia: 2% Lidocaine and 4% Marcaine in a 50/50 mixture with 10 unites/ml of Hylenex given as a peribulbar Anesthesiologist: Anesthesiologist: Gijsbertus Georgana Curio, MD CRNA: Stormy Fabian, CRNA Complications: none Estimated Blood Loss: less than 1 ml  Description of procedure:  The patient sat upright and the eyes were anesthetized with topical proparacaine. With the patient fixing at a distant target the 3 o'clock and 9 o'clock positions at the limbus were marked with an sterile indelible surgical marking pen.  The patient was given anesthesia and sedation via intravenous access. The patient was then prepped and draped in the usual fashion. A 25-gauge needle was bent to use for starting the capsulorhexis. A 5-0 silk suture was placed through the conjunctiva superior and inferiorly to serve as bridle sutures.   The Verion system was engaged and registration was performed. The positions of the incisions and the steep axis of the astigmatism were identified by the Verion system and marked with an indelible pen. The Verion heads up display was turned off.  Hemostasis was obtained at the the position of the main incision using an eraser cautery. A partial thickness groove was made at the at that location with a 64 Beaver blade and this was dissected anteriorly with an SYSCO. The anterior chamber was entered at 10 o'clock with a 1.0 mm paracentesis knife and through the lamellar dissection with a 2.6 mm Alcon keratome. Epi-Shugarcaine 0.5 CC [9  cc BSS Plus (Alcon), 3 cc 4% preservative-free lidocaine (Hospira) and 4 cc 1:1000 preservative-free, bisulfite-free epinephrine] was injected into the anterior chamber via the paracentesis tract. DiscoVisc was injected to replace the aqueous and a continuous tear curvilinear capsulorhexis was performed using a bent 25-gauge needle.  Balance salt on a syringe was used to perform hydro-dissection and phacoemulsification was carried out using a divide and conquer technique. Procedure(s) with comments: CATARACT EXTRACTION PHACO AND INTRAOCULAR LENS PLACEMENT (IOC) (Left) - [place] 11.1 AP% 52.2 CDE 5.79 fluid pack lot # [id number]. Irrigation/aspiration was used to remove the residual cortex and the capsular bag was inflated with DiscoVisc. The intraocular lens was inserted into the capsular bag using a Monarch Delivery System cartridge. The Verion heads up display was turned on and the lens was rotated so that the marks on the base of the haptics were aligned with the steep axis of astigmatism as identified by the Verion unit.   Irrigation/aspiration was used to remove the residual DiscoVisc. The I/A hand piece was pressed down on top of the lens to prevent rotation. The wound was inflated with balanced salt and checked for leaks. None were found. The position of the Toric lens was reconfirmed using the Verion unit. Miostat was injected via the paracentesis track and 0.1 ml of cefuroxime containing 1 mg of drug was injected via the paracentesis track. The wound was checked for leaks again and none were found. The position of the lens was again confirmed with the Verion unit.  The bridal sutures were removed and two drops of Vigamox were placed on the eye. An eye shield was placed to protect  the eye and the patient was discharged to the recovery area in good condition.   Santino Kinsella MD @T @

## 2015-09-11 NOTE — H&P (Signed)
See scanned note.

## 2015-09-11 NOTE — Discharge Instructions (Signed)
AMBULATORY SURGERY  DISCHARGE INSTRUCTIONS   1) The drugs that you were given will stay in your system until tomorrow so for the next 24 hours you should not:  A) Drive an automobile B) Make any legal decisions C) Drink any alcoholic beverage   2) You may resume regular meals tomorrow.  Today it is better to start with liquids and gradually work up to solid foods.  You may eat anything you prefer, but it is better to start with liquids, then soup and crackers, and gradually work up to solid foods.   3) Please notify your doctor immediately if you have any unusual bleeding, trouble breathing, redness and pain at the surgery site, drainage, fever, or pain not relieved by medication.    4) Additional Instructions:    Eye Surgery Discharge Instructions  Expect mild scratchy sensation or mild soreness. DO NOT RUB YOUR EYE!  The day of surgery:  Minimal physical activity, but bed rest is not required  No reading, computer work, or close hand work  No bending, lifting, or straining.  May watch TV  For 24 hours:  No driving, legal decisions, or alcoholic beverages  Safety precautions  Eat anything you prefer: It is better to start with liquids, then soup then solid foods.  _____ Eye patch should be worn until postoperative exam tomorrow.  ____ Solar shield eyeglasses should be worn for comfort in the sunlight/patch while sleeping  Resume all regular medications including aspirin or Coumadin if these were discontinued prior to surgery. You may shower, bathe, shave, or wash your hair. Tylenol may be taken for mild discomfort.  Call your doctor if you experience significant pain, nausea, or vomiting, fever > 101 or other signs of infection. [phone] or [phone] Specific instructions:  Follow-up Information    Follow up with DINGELDEIN,STEVEN, MD In 1 day.   Specialty:  Ophthalmology   Why:  November 22 at 8:20am   Contact information:   [address]   [place] [address] [phone]         Please contact your physician with any problems or Same Day Surgery at [phone], Monday through Friday 6 am to 4 pm, or [place] at [facility] number at [phone].

## 2015-09-11 NOTE — Transfer of Care (Signed)
Immediate Anesthesia Transfer of Care Note  Patient: Kenneth Erickson  Procedure(s) Performed: Procedure(s) with comments: CATARACT EXTRACTION PHACO AND INTRAOCULAR LENS PLACEMENT (IOC) (Left) - US 11.1 AP% 52.2 CDE 5.79 fluid pack lot # [id number]  Patient Location: PHASE II  Anesthesia Type:MAC  Level of Consciousness: Awake, Alert, Oriented  Airway & Oxygen Therapy: Patient Spontanous Breathing and Patient on room air   Post-op Assessment: Report given to RN and Post -op Vital signs reviewed and stable  Post vital signs: Reviewed and stable  Last Vitals:  Filed Vitals:   09/11/15 0821 09/11/15 0824  BP: 118/69 120/68  Pulse: 89 85  Temp: 36.2 C   Resp: 16 16    Complications: No apparent anesthesia complications

## 2015-09-11 NOTE — Op Note (Signed)
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 Item Serial No. Manufacturer Lot No. LRB No. Used  acrysof toric lens     [id number]     Left 1   Anesthesia: 2% Lidocaine and 4% Marcaine in a 50/50 mixture with 10 unites/ml of Hylenex given as a peribulbar Anesthesiologist: Anesthesiologist: Gijsbertus Georgana Curio, MD CRNA: Stormy Fabian, CRNA Complications: none Estimated Blood Loss: less than 1 ml  Description of procedure:  The patient sat upright and the eyes were anesthetized with topical proparacaine. With the patient fixing at a distant target the 3 o'clock and 9 o'clock positions at the limbus were marked with an sterile indelible surgical marking pen.  The patient was given anesthesia and sedation via intravenous access. The patient was then prepped and draped in the usual fashion. A 25-gauge needle was bent to use for starting the capsulorhexis. A 5-0 silk suture was placed through the conjunctiva superior and inferiorly to serve as bridle sutures.   The Verion system was engaged and registration was performed. The positions of the incisions and the steep axis of the astigmatism were identified by the Verion system and marked with an indelible pen. The Verion heads up display was turned off.  Hemostasis was obtained at the the position of the main incision using an eraser cautery. A partial thickness groove was made at the at that location with a 64 Beaver blade and this was dissected anteriorly with an SYSCO. The anterior chamber was entered at 10 o'clock with a 1.0 mm paracentesis knife and through the lamellar dissection with a 2.6 mm Alcon keratome. Epi-Shugarcaine 0.5 CC [9  cc BSS Plus (Alcon), 3 cc 4% preservative-free lidocaine (Hospira) and 4 cc 1:1000 preservative-free, bisulfite-free epinephrine] was injected into the anterior chamber via the paracentesis tract. DiscoVisc was injected to replace the aqueous and a continuous tear curvilinear capsulorhexis was performed using a bent 25-gauge needle.  Balance salt on a syringe was used to perform hydro-dissection and phacoemulsification was carried out using a divide and conquer technique. The lens was very soft so a hydrodeineation was performed to isolate the central aspect. After a central scult the center was broken into two pieces and the rest was removed with the I/A handpiece.   Procedure(s) with comments: CATARACT EXTRACTION PHACO AND INTRAOCULAR LENS PLACEMENT (IOC) (Left) - [place] 11.1 AP% 52.2 CDE 5.79 fluid pack lot # [id number].   Irrigation/aspiration was used to remove the residual cortex and the capsular bag was inflated with DiscoVisc. The intraocular lens was inserted into the capsular bag using a Monarch Delivery System cartridge. The Verion heads up display was turned on and the lens was rotated so that the marks on the base of the haptics were aligned with the steep axis of astigmatism as identified by the Verion unit.   Irrigation/aspiration was used to remove the residual DiscoVisc. The I/A hand piece was pressed down on top of the lens to prevent rotation. The wound was inflated with balanced salt and checked for leaks. None were found. The position of the Toric lens was reconfirmed using the Verion unit. Miostat was injected via the paracentesis track and 0.1 ml of cefuroxime containing 1 mg of drug was injected via the paracentesis track. The wound was checked for leaks again  and none were found. The position of the lens was reconfirmed using the Verion unit.   The bridal sutures were removed and two drops of Vigamox were placed on the eye. An eye shield was placed to protect the eye and the patient  was discharged to the recovery area in good condition.   Lizza Huffaker MD @T @

## 2015-09-11 NOTE — Interval H&P Note (Signed)
History and Physical Interval Note:  09/11/2015 7:27 AM  Kenneth Erickson  has presented today for surgery, with the diagnosis of nuclear sclerotic cataract left eye  The various methods of treatment have been discussed with the patient and family. After consideration of risks, benefits and other options for treatment, the patient has consented to  Procedure(s) with comments: CATARACT EXTRACTION PHACO AND INTRAOCULAR LENS PLACEMENT (IOC) (Left) - US AP% CDE fluid pack lot # [id number] as a surgical intervention .  The patient's history has been reviewed, patient examined, no change in status, stable for surgery.  I have reviewed the patient's chart and labs.  Questions were answered to the patient's satisfaction.     Jasdeep Dejarnett

## 2019-12-01 ENCOUNTER — Encounter
Admission: RE | Admit: 2019-12-01 | Discharge: 2019-12-01 | Disposition: A | Discharge status: Home / Self Care | Payer: Worker's Compensation | Source: Ambulatory Visit | Attending: Orthopedic Surgery | Admitting: Orthopedic Surgery

## 2019-12-01 ENCOUNTER — Other Ambulatory Visit: Payer: Self-pay

## 2019-12-01 ENCOUNTER — Other Ambulatory Visit: Payer: Self-pay | Admitting: Orthopedic Surgery

## 2019-12-01 HISTORY — DX: Personal history of urinary calculi: Z87.442

## 2019-12-01 HISTORY — DX: Gastro-esophageal reflux disease without esophagitis: K21.9

## 2019-12-01 HISTORY — DX: Pneumonia, unspecified organism: J18.9

## 2019-12-01 NOTE — Patient Instructions (Addendum)
Your procedure is scheduled on: Wednesday, Feb. 17 Report to Day Surgery on the 2nd floor of the [facility] To find out your arrival time, please call [phone] between 1PM - 3PM on: Tuesday, Feb. 16  REMEMBER: Instructions that are not followed completely may result in serious medical risk, up to and including death; or upon the discretion of your surgeon and anesthesiologist your surgery may need to be rescheduled.  Do not eat food after midnight the night before surgery.  No gum chewing, lozengers or hard candies.  You may however, drink water up to 2 hours before you are scheduled to arrive for your surgery. Do not drink anything within 2 hours of the start of your surgery.  ENSURE PRE-SURGERY CARBOHYDRATE DRINK:  Complete drinking 3 hours prior to surgery.  No Alcohol for 24 hours before or after surgery.  No Smoking including e-cigarettes for 24 hours prior to surgery.  No chewable tobacco products for at least 6 hours prior to surgery.  No nicotine patches on the day of surgery.  On the morning of surgery brush your teeth with toothpaste and water, you may rinse your mouth with mouthwash if you wish. Do not swallow any toothpaste or mouthwash.  Notify your doctor if there is any change in your medical condition (cold, fever, infection).  Do not wear jewelry, make-up, hairpins, clips or nail polish.  Do not wear lotions, powders, or perfumes.   Do not shave 48 hours prior to surgery.   Contacts and dentures may not be worn into surgery.  Do not bring valuables to the hospital, including drivers license, insurance or credit cards.  [facility] is not responsible for any belongings or valuables.   TAKE THESE MEDICATIONS THE MORNING OF SURGERY:  1.  Cimetidine - (take one the night before and one on the morning of surgery - helps to prevent nausea after surgery.)  Use CHG Soap as directed on instruction sheet.  According to Dr. Burnadette Pop clearance just hold  Metformin the day of surgery.  Stop Anti-inflammatories (NSAIDS) such as Advil, Aleve, Ibuprofen, Motrin, Naproxen, Naprosyn and Aspirin based products such as Excedrin, Goodys Powder, BC Powder. (May take Tylenol or Acetaminophen if needed.)  Stop ANY OVER THE COUNTER supplements until after surgery. (cinnamon, lecithin, turmeric) (May continue Vitamin D, Vitamin B, and multivitamin.)  Wear comfortable clothing (specific to your surgery type) to the hospital.  If you are being discharged the day of surgery, you will not be allowed to drive home. You will need a responsible adult to drive you home and stay with you that night.   If you are taking public transportation, you will need to have a responsible adult with you. Please confirm with your physician that it is acceptable to use public transportation.   Please call [phone] if you have any questions about these instructions.

## 2019-12-02 ENCOUNTER — Encounter
Admission: RE | Admit: 2019-12-02 | Discharge: 2019-12-02 | Disposition: A | Discharge status: Home / Self Care | Payer: Worker's Compensation | Source: Ambulatory Visit | Attending: Orthopedic Surgery | Admitting: Orthopedic Surgery

## 2019-12-02 DIAGNOSIS — Z01818 Encounter for other preprocedural examination: Secondary | ICD-10-CM | POA: Insufficient documentation

## 2019-12-02 DIAGNOSIS — R000 Tachycardia, unspecified: Secondary | ICD-10-CM | POA: Diagnosis not present

## 2019-12-02 DIAGNOSIS — E119 Type 2 diabetes mellitus without complications: Secondary | ICD-10-CM | POA: Insufficient documentation

## 2019-12-02 DIAGNOSIS — I10 Essential (primary) hypertension: Secondary | ICD-10-CM | POA: Insufficient documentation

## 2019-12-02 DIAGNOSIS — M75122 Complete rotator cuff tear or rupture of left shoulder, not specified as traumatic: Secondary | ICD-10-CM | POA: Insufficient documentation

## 2019-12-02 LAB — CBC
HCT: 42.6 % (ref 39.0–52.0)
Hemoglobin: 14.9 g/dL (ref 13.0–17.0)
MCH: 30 pg (ref 26.0–34.0)
MCHC: 35 g/dL (ref 30.0–36.0)
MCV: 85.9 fL (ref 80.0–100.0)
Platelets: 268 10*3/uL (ref 150–400)
RBC: 4.96 MIL/uL (ref 4.22–5.81)
RDW: 12.5 % (ref 11.5–15.5)
WBC: 9.5 10*3/uL (ref 4.0–10.5)
nRBC: 0 % (ref 0.0–0.2)

## 2019-12-02 LAB — BASIC METABOLIC PANEL
Anion gap: 14 (ref 5–15)
BUN: 29 mg/dL — ABNORMAL HIGH (ref 6–20)
CO2: 24 mmol/L (ref 22–32)
Calcium: 9.5 mg/dL (ref 8.9–10.3)
Chloride: 100 mmol/L (ref 98–111)
Creatinine, Ser: 1.06 mg/dL (ref 0.61–1.24)
GFR calc Af Amer: >60 mL/min (ref >60)
GFR calc non Af Amer: >60 mL/min (ref >60)
Glucose, Bld: 166 mg/dL — ABNORMAL HIGH (ref 70–99)
Potassium: 3.4 mmol/L — ABNORMAL LOW (ref 3.5–5.1)
Sodium: 138 mmol/L (ref 135–145)

## 2019-12-02 LAB — PROTIME-INR
INR: 1 (ref 0.8–1.2)
Prothrombin Time: 13.4 seconds (ref 11.4–15.2)

## 2019-12-02 LAB — URINALYSIS, ROUTINE W REFLEX MICROSCOPIC
Bilirubin Urine: NEGATIVE
Glucose, UA: NEGATIVE mg/dL
Hgb urine dipstick: NEGATIVE
Ketones, ur: NEGATIVE mg/dL
Leukocytes,Ua: NEGATIVE
Nitrite: NEGATIVE
Protein, ur: NEGATIVE mg/dL
Specific Gravity, Urine: 1.023 (ref 1.005–1.030)
pH: 5 (ref 5.0–8.0)

## 2019-12-02 LAB — APTT: aPTT: 31 seconds (ref 24–36)

## 2019-12-02 NOTE — Pre-Procedure Instructions (Signed)
Dr. Karlton Lemon reviewed EKG and said it was OK to proceed.

## 2019-12-06 ENCOUNTER — Other Ambulatory Visit
Admission: RE | Admit: 2019-12-06 | Discharge: 2019-12-06 | Disposition: A | Discharge status: Home / Self Care | Payer: Worker's Compensation | Source: Ambulatory Visit | Attending: Orthopedic Surgery | Admitting: Orthopedic Surgery

## 2019-12-06 ENCOUNTER — Other Ambulatory Visit: Payer: Self-pay

## 2019-12-06 DIAGNOSIS — Z01812 Encounter for preprocedural laboratory examination: Secondary | ICD-10-CM | POA: Diagnosis present

## 2019-12-06 DIAGNOSIS — Z20822 Contact with and (suspected) exposure to covid-19: Secondary | ICD-10-CM | POA: Diagnosis not present

## 2019-12-07 LAB — SARS CORONAVIRUS 2 (TAT 6-24 HRS): SARS Coronavirus 2: NEGATIVE

## 2019-12-07 MED ORDER — DEXTROSE 5 % IV SOLN
3.0000 g | INTRAVENOUS | Status: AC
  Administered 2019-12-08: 3 g via INTRAVENOUS
  Filled 2019-12-07: qty 3

## 2019-12-08 ENCOUNTER — Ambulatory Visit: Payer: Worker's Compensation

## 2019-12-08 ENCOUNTER — Ambulatory Visit
Admission: RE | Admit: 2019-12-08 | Discharge: 2019-12-08 | Disposition: A | Discharge status: Home / Self Care | Payer: Worker's Compensation | Attending: Orthopedic Surgery | Admitting: Orthopedic Surgery

## 2019-12-08 ENCOUNTER — Ambulatory Visit: Payer: Worker's Compensation | Admitting: Anesthesiology

## 2019-12-08 ENCOUNTER — Other Ambulatory Visit: Payer: Self-pay

## 2019-12-08 ENCOUNTER — Encounter
Admission: RE | Disposition: A | Discharge status: Home / Self Care | Payer: Self-pay | Source: Home / Self Care | Attending: Orthopedic Surgery

## 2019-12-08 ENCOUNTER — Encounter: Payer: Self-pay | Admitting: Orthopedic Surgery

## 2019-12-08 DIAGNOSIS — E782 Mixed hyperlipidemia: Secondary | ICD-10-CM | POA: Insufficient documentation

## 2019-12-08 DIAGNOSIS — E669 Obesity, unspecified: Secondary | ICD-10-CM | POA: Insufficient documentation

## 2019-12-08 DIAGNOSIS — Z6837 Body mass index (BMI) 37.0-37.9, adult: Secondary | ICD-10-CM | POA: Insufficient documentation

## 2019-12-08 DIAGNOSIS — E119 Type 2 diabetes mellitus without complications: Secondary | ICD-10-CM | POA: Insufficient documentation

## 2019-12-08 DIAGNOSIS — Z7982 Long term (current) use of aspirin: Secondary | ICD-10-CM | POA: Insufficient documentation

## 2019-12-08 DIAGNOSIS — Z79899 Other long term (current) drug therapy: Secondary | ICD-10-CM | POA: Insufficient documentation

## 2019-12-08 DIAGNOSIS — M75122 Complete rotator cuff tear or rupture of left shoulder, not specified as traumatic: Secondary | ICD-10-CM | POA: Diagnosis not present

## 2019-12-08 DIAGNOSIS — I10 Essential (primary) hypertension: Secondary | ICD-10-CM | POA: Diagnosis not present

## 2019-12-08 DIAGNOSIS — Z888 Allergy status to other drugs, medicaments and biological substances status: Secondary | ICD-10-CM | POA: Insufficient documentation

## 2019-12-08 DIAGNOSIS — M75100 Unspecified rotator cuff tear or rupture of unspecified shoulder, not specified as traumatic: Secondary | ICD-10-CM

## 2019-12-08 DIAGNOSIS — Z7984 Long term (current) use of oral hypoglycemic drugs: Secondary | ICD-10-CM | POA: Insufficient documentation

## 2019-12-08 HISTORY — PX: SHOULDER ARTHROSCOPY WITH SUBACROMIAL DECOMPRESSION, ROTATOR CUFF REPAIR AND BICEP TENDON REPAIR: SHX5687

## 2019-12-08 LAB — GLUCOSE, CAPILLARY
Glucose-Capillary: 194 mg/dL — ABNORMAL HIGH (ref 70–99)
Glucose-Capillary: 272 mg/dL — ABNORMAL HIGH (ref 70–99)
Glucose-Capillary: 269 mg/dL — ABNORMAL HIGH (ref 70–99)
Glucose-Capillary: 282 mg/dL — ABNORMAL HIGH (ref 70–99)

## 2019-12-08 SURGERY — SHOULDER ARTHROSCOPY WITH SUBACROMIAL DECOMPRESSION, ROTATOR CUFF REPAIR AND BICEP TENDON REPAIR
Anesthesia: General | Laterality: Left

## 2019-12-08 MED ORDER — PROPOFOL 500 MG/50ML IV EMUL
INTRAVENOUS | Status: AC
  Filled 2019-12-08: qty 50

## 2019-12-08 MED ORDER — ONDANSETRON HCL 4 MG PO TABS: 4.0000 mg | ORAL_TABLET | Freq: Four times a day (QID) | ORAL | Status: DC | PRN

## 2019-12-08 MED ORDER — MIDAZOLAM HCL 2 MG/2ML IJ SOLN
INTRAMUSCULAR | Status: AC
  Filled 2019-12-08: qty 2

## 2019-12-08 MED ORDER — BUPIVACAINE HCL (PF) 0.25 % IJ SOLN
INTRAMUSCULAR | Status: AC
  Filled 2019-12-08: qty 30

## 2019-12-08 MED ORDER — EPINEPHRINE (ANAPHYLAXIS) 30 MG/30ML IJ SOLN
INTRAMUSCULAR | Status: DC | PRN
  Administered 2019-12-08: 4 mg

## 2019-12-08 MED ORDER — LACTATED RINGERS IV SOLN: INTRAVENOUS | Status: DC

## 2019-12-08 MED ORDER — MIDAZOLAM HCL 2 MG/2ML IJ SOLN: 1.0000 mg | Freq: Once | INTRAMUSCULAR | Status: AC

## 2019-12-08 MED ORDER — CHLORHEXIDINE GLUCONATE 4 % EX LIQD: 60.0000 mL | Freq: Once | CUTANEOUS | Status: DC

## 2019-12-08 MED ORDER — BUPIVACAINE LIPOSOME 1.3 % IJ SUSP
INTRAMUSCULAR | Status: DC | PRN
  Administered 2019-12-08: 20 mL

## 2019-12-08 MED ORDER — ACETAMINOPHEN 10 MG/ML IV SOLN
INTRAVENOUS | Status: DC | PRN
  Administered 2019-12-08: 1000 mg via INTRAVENOUS

## 2019-12-08 MED ORDER — FENTANYL CITRATE (PF) 100 MCG/2ML IJ SOLN
INTRAMUSCULAR | Status: AC
  Filled 2019-12-08: qty 2

## 2019-12-08 MED ORDER — HYDROCODONE-ACETAMINOPHEN 7.5-325 MG PO TABS
1.0000 | ORAL_TABLET | ORAL | Status: DC | PRN
  Filled 2019-12-08: qty 2

## 2019-12-08 MED ORDER — HYDROCODONE-ACETAMINOPHEN 5-325 MG PO TABS: 1.0000 | ORAL_TABLET | ORAL | 0 refills | Status: DC | PRN

## 2019-12-08 MED ORDER — DEXMEDETOMIDINE HCL 200 MCG/2ML IV SOLN
INTRAVENOUS | Status: DC | PRN
  Administered 2019-12-08: 12 ug via INTRAVENOUS

## 2019-12-08 MED ORDER — DEXAMETHASONE SODIUM PHOSPHATE 10 MG/ML IJ SOLN
INTRAMUSCULAR | Status: DC | PRN
  Administered 2019-12-08: 10 mg via INTRAVENOUS

## 2019-12-08 MED ORDER — LIDOCAINE HCL 1 % IJ SOLN
INTRAMUSCULAR | Status: AC
  Filled 2019-12-08: qty 2

## 2019-12-08 MED ORDER — LACTATED RINGERS IV SOLN: INTRAVENOUS | Status: DC | PRN

## 2019-12-08 MED ORDER — PHENYLEPHRINE HCL (PRESSORS) 10 MG/ML IV SOLN
INTRAVENOUS | Status: DC | PRN
  Administered 2019-12-08: 100 ug via INTRAVENOUS
  Administered 2019-12-08 (×2): 200 ug via INTRAVENOUS
  Administered 2019-12-08 (×3): 100 ug via INTRAVENOUS

## 2019-12-08 MED ORDER — MORPHINE SULFATE (PF) 4 MG/ML IV SOLN: 0.5000 mg | INTRAVENOUS | Status: DC | PRN

## 2019-12-08 MED ORDER — ROPIVACAINE HCL 5 MG/ML IJ SOLN
INTRAMUSCULAR | Status: AC
  Filled 2019-12-08: qty 20

## 2019-12-08 MED ORDER — SODIUM CHLORIDE FLUSH 0.9 % IV SOLN
INTRAVENOUS | Status: AC
  Filled 2019-12-08: qty 40

## 2019-12-08 MED ORDER — FENTANYL CITRATE (PF) 100 MCG/2ML IJ SOLN: 50.0000 ug | Freq: Once | INTRAMUSCULAR | Status: AC

## 2019-12-08 MED ORDER — SEVOFLURANE IN SOLN
RESPIRATORY_TRACT | Status: AC
  Filled 2019-12-08: qty 250

## 2019-12-08 MED ORDER — KETOROLAC TROMETHAMINE 15 MG/ML IJ SOLN: 15.0000 mg | Freq: Four times a day (QID) | INTRAMUSCULAR | Status: DC

## 2019-12-08 MED ORDER — INSULIN ASPART 100 UNIT/ML ~~LOC~~ SOLN
10.0000 [IU] | Freq: Once | SUBCUTANEOUS | Status: AC
  Administered 2019-12-08: 10 [IU] via SUBCUTANEOUS

## 2019-12-08 MED ORDER — LIDOCAINE HCL (CARDIAC) PF 100 MG/5ML IV SOSY
PREFILLED_SYRINGE | INTRAVENOUS | Status: DC | PRN
  Administered 2019-12-08: 100 mg via INTRAVENOUS

## 2019-12-08 MED ORDER — BUPIVACAINE HCL (PF) 0.5 % IJ SOLN
INTRAMUSCULAR | Status: AC
  Filled 2019-12-08: qty 10

## 2019-12-08 MED ORDER — METOCLOPRAMIDE HCL 10 MG PO TABS: 5.0000 mg | ORAL_TABLET | Freq: Three times a day (TID) | ORAL | Status: DC | PRN

## 2019-12-08 MED ORDER — DOCUSATE SODIUM 100 MG PO CAPS: 100.0000 mg | ORAL_CAPSULE | Freq: Every day | ORAL | 2 refills | Status: AC | PRN

## 2019-12-08 MED ORDER — SUGAMMADEX SODIUM 500 MG/5ML IV SOLN
INTRAVENOUS | Status: DC | PRN
  Administered 2019-12-08: 200 mg via INTRAVENOUS

## 2019-12-08 MED ORDER — ACETAMINOPHEN 10 MG/ML IV SOLN
INTRAVENOUS | Status: AC
  Filled 2019-12-08: qty 100

## 2019-12-08 MED ORDER — HYDROCODONE-ACETAMINOPHEN 5-325 MG PO TABS: 1.0000 | ORAL_TABLET | ORAL | Status: DC | PRN

## 2019-12-08 MED ORDER — BUPIVACAINE LIPOSOME 1.3 % IJ SUSP
INTRAMUSCULAR | Status: AC
  Filled 2019-12-08: qty 20

## 2019-12-08 MED ORDER — FENTANYL CITRATE (PF) 100 MCG/2ML IJ SOLN
INTRAMUSCULAR | Status: DC | PRN
  Administered 2019-12-08: 100 ug via INTRAVENOUS
  Administered 2019-12-08: 50 ug via INTRAVENOUS

## 2019-12-08 MED ORDER — INSULIN ASPART 100 UNIT/ML ~~LOC~~ SOLN
6.0000 [IU] | Freq: Once | SUBCUTANEOUS | Status: AC
  Administered 2019-12-08: 6 [IU] via SUBCUTANEOUS

## 2019-12-08 MED ORDER — ROCURONIUM BROMIDE 100 MG/10ML IV SOLN
INTRAVENOUS | Status: DC | PRN
  Administered 2019-12-08: 50 mg via INTRAVENOUS

## 2019-12-08 MED ORDER — VASOPRESSIN 20 UNIT/ML IV SOLN
INTRAVENOUS | Status: DC | PRN
  Administered 2019-12-08: 1 [IU] via INTRAVENOUS

## 2019-12-08 MED ORDER — BUPIVACAINE HCL (PF) 0.5 % IJ SOLN
INTRAMUSCULAR | Status: DC | PRN
  Administered 2019-12-08: 5 mL

## 2019-12-08 MED ORDER — FENTANYL CITRATE (PF) 100 MCG/2ML IJ SOLN
INTRAMUSCULAR | Status: AC
  Administered 2019-12-08: 50 ug via INTRAVENOUS
  Filled 2019-12-08: qty 2

## 2019-12-08 MED ORDER — INSULIN ASPART 100 UNIT/ML ~~LOC~~ SOLN
SUBCUTANEOUS | Status: AC
  Filled 2019-12-08: qty 1

## 2019-12-08 MED ORDER — PROPOFOL 10 MG/ML IV BOLUS
INTRAVENOUS | Status: AC
  Filled 2019-12-08: qty 20

## 2019-12-08 MED ORDER — PROPOFOL 10 MG/ML IV BOLUS
INTRAVENOUS | Status: DC | PRN
  Administered 2019-12-08: 200 mg via INTRAVENOUS

## 2019-12-08 MED ORDER — MIDAZOLAM HCL 2 MG/2ML IJ SOLN
INTRAMUSCULAR | Status: AC
  Administered 2019-12-08: 08:00:00 1 mg via INTRAVENOUS
  Filled 2019-12-08: qty 2

## 2019-12-08 MED ORDER — OXYCODONE HCL 5 MG/5ML PO SOLN: 5.0000 mg | Freq: Once | ORAL | Status: DC | PRN

## 2019-12-08 MED ORDER — METOCLOPRAMIDE HCL 5 MG/ML IJ SOLN: 5.0000 mg | Freq: Three times a day (TID) | INTRAMUSCULAR | Status: DC | PRN

## 2019-12-08 MED ORDER — ACETAMINOPHEN 10 MG/ML IV SOLN: 1000.0000 mg | Freq: Once | INTRAVENOUS | Status: DC | PRN

## 2019-12-08 MED ORDER — SODIUM CHLORIDE 0.9 % IV SOLN: INTRAVENOUS | Status: DC

## 2019-12-08 MED ORDER — ONDANSETRON HCL 4 MG/2ML IJ SOLN: 4.0000 mg | Freq: Four times a day (QID) | INTRAMUSCULAR | Status: DC | PRN

## 2019-12-08 MED ORDER — ONDANSETRON HCL 4 MG/2ML IJ SOLN
INTRAMUSCULAR | Status: DC | PRN
  Administered 2019-12-08: 4 mg via INTRAVENOUS

## 2019-12-08 MED ORDER — OXYCODONE HCL 5 MG PO TABS: 5.0000 mg | ORAL_TABLET | Freq: Once | ORAL | Status: DC | PRN

## 2019-12-08 MED ORDER — EPHEDRINE SULFATE 50 MG/ML IJ SOLN
INTRAMUSCULAR | Status: DC | PRN
  Administered 2019-12-08: 10 mg via INTRAVENOUS
  Administered 2019-12-08: 15 mg via INTRAVENOUS

## 2019-12-08 MED ORDER — ONDANSETRON HCL 4 MG/2ML IJ SOLN: 4.0000 mg | Freq: Once | INTRAMUSCULAR | Status: DC | PRN

## 2019-12-08 MED ORDER — ACETAMINOPHEN 325 MG PO TABS: 325.0000 mg | ORAL_TABLET | Freq: Four times a day (QID) | ORAL | Status: DC | PRN

## 2019-12-08 MED ORDER — FENTANYL CITRATE (PF) 100 MCG/2ML IJ SOLN: 25.0000 ug | INTRAMUSCULAR | Status: DC | PRN

## 2019-12-08 MED ORDER — EPINEPHRINE PF 1 MG/ML IJ SOLN
INTRAMUSCULAR | Status: AC
  Filled 2019-12-08: qty 5

## 2019-12-08 SURGICAL SUPPLY — 73 items
4.0mm short burr sheath ×3 IMPLANT
ADAPTER IRRIG TUBE 2 SPIKE SOL (ADAPTER) ×6 IMPLANT
ANCHOR SUT CROSSFT 4.75 (Anchor) ×3 IMPLANT
ANCHOR YKNOT PRO RC HI-FI TAPE (Anchor) ×3 IMPLANT
BLADE FULL RADIUS 3.5 (BLADE) ×3 IMPLANT
BLADE INCISOR PLUS 4.5 (BLADE) ×3 IMPLANT
BLADE SHAVER 4.5 DBL SERAT CV (CUTTER) ×3 IMPLANT
BLADE SURG MINI STRL (BLADE) ×3 IMPLANT
BRUSH SCRUB EZ  4% CHG (MISCELLANEOUS) ×2
BRUSH SCRUB EZ 4% CHG (MISCELLANEOUS) ×1 IMPLANT
BUR ACROMIONIZER 4.0 (BURR) ×3 IMPLANT
BUR BR 5.5 WIDE MOUTH (BURR) IMPLANT
CANNULA 5.75X7 CRYSTAL CLEAR (CANNULA) IMPLANT
CANNULA PARTIAL THREAD 2X7 (CANNULA) IMPLANT
CANNULA SHOULDER 7CM (CANNULA) IMPLANT
CANNULA TWIST IN 8.25X7CM (CANNULA) ×3 IMPLANT
CANNULA TWIST IN 8.25X9CM (CANNULA) ×3 IMPLANT
CHLORAPREP W/TINT 26 (MISCELLANEOUS) ×3 IMPLANT
CLOSURE WOUND 1/4X4 (GAUZE/BANDAGES/DRESSINGS)
COOLER POLAR GLACIER W/PUMP (MISCELLANEOUS) ×3 IMPLANT
COVER WAND RF STERILE (DRAPES) ×3 IMPLANT
CRADLE LAMINECT ARM (MISCELLANEOUS) ×3 IMPLANT
DEVICE SUCT BLK HOLE OR FLOOR (MISCELLANEOUS) ×3 IMPLANT
DRAPE 3/4 80X56 (DRAPES) ×3 IMPLANT
DRAPE IMP U-DRAPE 54X76 (DRAPES) ×6 IMPLANT
DRAPE INCISE IOBAN 66X45 STRL (DRAPES) ×3 IMPLANT
DRAPE STERI 35X30 U-POUCH (DRAPES) ×3 IMPLANT
DRAPE U-SHAPE 47X51 STRL (DRAPES) ×3 IMPLANT
ELECT REM PT RETURN 9FT ADLT (ELECTROSURGICAL) ×3
ELECTRODE REM PT RTRN 9FT ADLT (ELECTROSURGICAL) ×1 IMPLANT
GAUZE 4X4 16PLY RFD (DISPOSABLE) IMPLANT
GAUZE SPONGE 4X4 12PLY STRL (GAUZE/BANDAGES/DRESSINGS) ×3 IMPLANT
GAUZE XEROFORM 1X8 LF (GAUZE/BANDAGES/DRESSINGS) ×3 IMPLANT
GLOVE BIOGEL PI IND STRL 8 (GLOVE) ×1 IMPLANT
GLOVE BIOGEL PI INDICATOR 8 (GLOVE) ×2
GLOVE SURG ORTHO 8.0 STRL STRW (GLOVE) ×3 IMPLANT
GOWN STRL REUS W/ TWL LRG LVL3 (GOWN DISPOSABLE) ×1 IMPLANT
GOWN STRL REUS W/ TWL XL LVL3 (GOWN DISPOSABLE) ×1 IMPLANT
GOWN STRL REUS W/TWL LRG LVL3 (GOWN DISPOSABLE) ×2
GOWN STRL REUS W/TWL XL LVL3 (GOWN DISPOSABLE) ×2
IV LACTATED RINGER IRRG 3000ML (IV SOLUTION) ×12
IV LR IRRIG 3000ML ARTHROMATIC (IV SOLUTION) ×6 IMPLANT
KIT STABILIZATION SHOULDER (MISCELLANEOUS) ×3 IMPLANT
KIT TURNOVER KIT A (KITS) ×3 IMPLANT
MANIFOLD NEPTUNE II (INSTRUMENTS) ×6 IMPLANT
MASK FACE SPIDER DISP (MASK) ×3 IMPLANT
MAT ABSORB  FLUID 56X50 GRAY (MISCELLANEOUS) ×2
MAT ABSORB FLUID 56X50 GRAY (MISCELLANEOUS) ×1 IMPLANT
NDL SAFETY ECLIPSE 18X1.5 (NEEDLE) ×1 IMPLANT
NEEDLE HYPO 18GX1.5 SHARP (NEEDLE) ×2
NEEDLE HYPO 22GX1.5 SAFETY (NEEDLE) ×3 IMPLANT
NEEDLE SCORPION MULTI FIRE (NEEDLE) ×3 IMPLANT
NEEDLE SPNL 18GX3.5 QUINCKE PK (NEEDLE) ×3 IMPLANT
PACK ARTHROSCOPY SHOULDER (MISCELLANEOUS) ×3 IMPLANT
PAD ABD DERMACEA PRESS 5X9 (GAUZE/BANDAGES/DRESSINGS) IMPLANT
PAD WRAPON POLAR SHDR XLG (MISCELLANEOUS) ×1 IMPLANT
SLING ARM LRG DEEP (SOFTGOODS) IMPLANT
SLING ULTRA II LG (MISCELLANEOUS) ×3 IMPLANT
STRAP SAFETY 5IN WIDE (MISCELLANEOUS) ×3 IMPLANT
STRIP CLOSURE SKIN 1/4X4 (GAUZE/BANDAGES/DRESSINGS) IMPLANT
SUT ETHILON NAB PS2 4-0 18IN (SUTURE) ×3 IMPLANT
SUT FIBERWIRE #2 38 T-5 BLUE (SUTURE)
SUT PDS AB 0 CT1 27 (SUTURE) ×3 IMPLANT
SUT TIGER TAPE 7 IN WHITE (SUTURE) IMPLANT
SUTURE FIBERWR #2 38 T-5 BLUE (SUTURE) IMPLANT
SYR 10ML LL (SYRINGE) ×3 IMPLANT
SYR 50ML LL SCALE MARK (SYRINGE) ×3 IMPLANT
TAPE MICROFOAM 4IN (TAPE) ×3 IMPLANT
TUBING ARTHRO INFLOW-ONLY STRL (TUBING) ×3 IMPLANT
TUBING CONNECTING 10 (TUBING) ×2 IMPLANT
TUBING CONNECTING 10' (TUBING) ×1
WAND WEREWOLF FLOW 90D (MISCELLANEOUS) ×3 IMPLANT
WRAPON POLAR PAD SHDR XLG (MISCELLANEOUS) ×3

## 2019-12-08 NOTE — Anesthesia Procedure Notes (Addendum)
Procedure Name: Intubation Date/Time: 12/08/2019 8:48 AM Performed by: Almeta Monas, CRNA Pre-anesthesia Checklist: Patient identified, Patient being monitored, Timeout performed, Emergency Drugs available and Suction available Patient Re-evaluated:Patient Re-evaluated prior to induction Oxygen Delivery Method: Circle system utilized Preoxygenation: Pre-oxygenation with 100% oxygen Induction Type: IV induction Ventilation: Mask ventilation with difficulty and Oral airway inserted - appropriate to patient size Laryngoscope Size: 3 and McGraph Grade View: Grade I Tube type: Oral Tube size: 7.0 mm Number of attempts: 1 Airway Equipment and Method: Stylet and Video-laryngoscopy Placement Confirmation: ETT inserted through vocal cords under direct vision,  positive ETCO2 and breath sounds checked- equal and bilateral Secured at: 21 cm Tube secured with: Tape Dental Injury: Teeth and Oropharynx as per pre-operative assessment

## 2019-12-08 NOTE — Progress Notes (Signed)
CBG recheck: 269. Dr. Suzan Slick notified. Acknowledged. Orders received.

## 2019-12-08 NOTE — Anesthesia Procedure Notes (Signed)
Anesthesia Regional Block: Interscalene brachial plexus block   Pre-Anesthetic Checklist: ,, timeout performed, Correct Patient, Correct Site, Correct Laterality, Correct Procedure, Correct Position, site marked, Risks and benefits discussed,  Surgical consent,  Pre-op evaluation,  At surgeon's request and post-op pain management  Laterality: Left  Prep: chloraprep       Needles:  Injection technique: Single-shot  Needle Type: Echogenic Needle     Needle Length: 4cm  Needle Gauge: 25     Additional Needles:   Narrative:  Start time: 12/08/2019 8:20 AM End time: 12/08/2019 8:26 AM Injection made incrementally with aspirations every 5 mL.  Performed by: Personally  Anesthesiologist: Corinda Gubler, MD  Additional Notes: Patient consented for risk and benefits of nerve block including but not limited to: nerve damage, failed block, bleeding and infection, hemidiaphragmatic paralysis and shortness of breath, Horner's syndrome.  Patient voiced understanding.  Functioning IV was confirmed and monitors were applied.  Sterile prep,hand hygiene and sterile gloves were used.  Minimal sedation used for procedure.   No paresthesia endorsed by patient during the procedure.  Negative aspiration and negative test dose prior to incremental administration of local anesthetic. The patient tolerated the procedure well with no immediate complications.

## 2019-12-08 NOTE — Anesthesia Preprocedure Evaluation (Signed)
Anesthesia Evaluation  Patient identified by MRN, date of birth, ID band Patient awake    Reviewed: Allergy & Precautions, NPO status , Patient's Chart, lab work & pertinent test results  History of Anesthesia Complications Negative for: history of anesthetic complications  Airway Mallampati: II  TM Distance: >3 FB Neck ROM: Full    Dental no notable dental hx. (+) Teeth Intact, Chipped,    Pulmonary neg pulmonary ROS, neg sleep apnea, neg COPD, Patient abstained from smoking.Not current smoker,    Pulmonary exam normal breath sounds clear to auscultation       Cardiovascular Exercise Tolerance: Good METShypertension, Pt. 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[employer], [employer] distribution center, very active   Neuro/Psych negative neurological ROS  negative psych ROS   GI/Hepatic GERD  Controlled and Medicated,(+)     (-) substance abuse  ,   Endo/Other  diabetes, Type 2  Renal/GU negative Renal ROS     Musculoskeletal   Abdominal   Peds  Hematology   Anesthesia Other Findings Past Medical History: No date: Diabetes mellitus without complication (HCC) No date: GERD (gastroesophageal reflux disease) No date: History of kidney stones No date: Hypertension No date: Pneumonia  Reproductive/Obstetrics                             Anesthesia Physical Anesthesia Plan  ASA: II  Anesthesia Plan: General   Post-op Pain Management:  Regional for Post-op pain   Induction: Intravenous  PONV Risk Score and Plan: 3 and Ondansetron, Dexamethasone and Midazolam  Airway Management Planned: Oral ETT  Additional Equipment: None  Intra-op Plan:   Post-operative Plan: Extubation in OR  Informed Consent: I have reviewed the patients History and Physical, chart, labs and discussed the procedure including the risks, benefits and  alternatives for the proposed anesthesia with the patient or authorized representative who has indicated his/her understanding and acceptance.     Dental advisory given  Plan Discussed with: CRNA and Surgeon  Anesthesia Plan Comments: (Discussed risks of anesthesia with patient, including PONV, sore throat, lip/dental damage. Rare risks discussed as well, such as cardiorespiratory sequelae. Patient understands.  Discussed r/b/a of interscalene block, including elective nature. Risks discussed: - Rare: bleeding, infection, nerve damage - shortness of breath from hemidiaphragmatic paralysis - unilateral horner's syndrome - poor/non-working blocks Patient understands and agrees. )        Anesthesia Quick Evaluation

## 2019-12-08 NOTE — Transfer of Care (Signed)
Immediate Anesthesia Transfer of Care Note  Patient: Kenneth Erickson  Procedure(s) Performed: LEFT SHOULDER ARTHROSCOPY WITH SUBACROMIAL DECOMPRESSION, ROTATOR CUFF REPAIR, DISTAL CLAVICLE EXISION, BICEPS TENOTOMY (Left )  Patient Location: PACU  Anesthesia Type:General  Level of Consciousness: sedated  Airway & Oxygen Therapy: Patient Spontanous Breathing and Patient connected to face mask oxygen  Post-op Assessment: Report given to RN and Post -op Vital signs reviewed and stable  Post vital signs: Reviewed and stable  Last Vitals:  Vitals Value Taken Time  BP 112/76 12/08/19 1046  Temp 35.9 C 12/08/19 1046  Pulse 93 12/08/19 1050  Resp 14 12/08/19 1050  SpO2 96 % 12/08/19 1050  Vitals shown include unvalidated device data.  Last Pain:  Vitals:   12/08/19 0654  TempSrc: Temporal  PainSc: 0-No pain         Complications: No apparent anesthesia complications

## 2019-12-08 NOTE — Discharge Instructions (Addendum)
Wear sling at all times, including sleep.  You will need to use the sling for a total of 4 weeks following surgery.  Do not try and lift your arm up or away from your body for any reason.   Keep the dressing dry.  You may remove bandage in 3 days.  You may place Band-Aids over top of the incisions.  May shower once dressing is removed in 3 days.  Remove sling carefully only for showers, leaving arm down by your side while in the shower.  +++ Make sure to take some pain medication this evening before you fall asleep, in preparation for the nerve block wearing off in the middle of the night.  If the the pain medication causes itching, or is too strong, try taking a single tablet at a time, or combining with Benadryl.  You may be most comfortable sleeping in a recliner.  If you do sleep in near bed, placed pillows behind the shoulder that have the operation to support it.     AMBULATORY SURGERY  DISCHARGE INSTRUCTIONS   1) The drugs that you were given will stay in your system until tomorrow so for the next 24 hours you should not:  A) Drive an automobile B) Make any legal decisions C) Drink any alcoholic beverage   2) You may resume regular meals tomorrow.  Today it is better to start with liquids and gradually work up to solid foods.  You may eat anything you prefer, but it is better to start with liquids, then soup and crackers, and gradually work up to solid foods.   3) Please notify your doctor immediately if you have any unusual bleeding, trouble breathing, redness and pain at the surgery site, drainage, fever, or pain not relieved by medication.    4) Additional Instructions:        Please contact your physician with any problems or Same Day Surgery at [phone], Monday through Friday 6 am to 4 pm, or [facility] number at [phone].    Interscalene Nerve Block, Care After This sheet gives you information about how to care for yourself  after your procedure. Your health care provider may also give you more specific instructions. If you have problems or questions, contact your health care provider. What can I expect after the procedure? After the procedure, it is common to have:  Soreness or tenderness in your neck.  Numbness in your shoulder, upper arm, and some fingers.  Weakness in your shoulder and arm muscles. The feeling and strength in your shoulder, arm, and fingers should return to normal within hours after your procedure. Follow these instructions at home: For at least 24 hours after the procedure:  Do not: ? Participate in activities in which you could fall or become injured. ? Drive. ? Use heavy machinery. ? Drink alcohol. ? Take sleeping pills or medicines that cause drowsiness. ? Make important decisions or sign legal documents. ? Take care of children on your own.  Rest. Eating and drinking  If you vomit, drink water, juice, or soup when you can drink without vomiting.  Make sure you have little or no nausea before eating solid foods.  Follow the diet that is recommended by your health care provider. If you have a sling:  Wear it as told by your health care provider. Remove it only as told by your health care provider.  Loosen the sling if your fingers tingle, become numb, or turn cold and blue.    Make sure that your entire arm, including your wrist, is supported. Do not allow your wrist to dangle over the end of the sling.  Do not let your sling get wet if it is not waterproof.  Keep the sling clean. Bathing  Do not take baths, swim, or use a hot tub until your health care provider approves.  If you have a nerve block catheter in place, keep the incision site and tubing dry. Injection site care   Wash your hands with soap and water before you change your bandage (dressing). If soap and water are not available, use hand sanitizer.  Change your dressing as told by your health care  provider.  Keep your dressing dry.  Check your nerve block injection site every day for signs of infection. Check for: ? Redness, swelling, or pain. ? Fluid or blood. ? Warmth. Activity  Do not perform complex or risky activities while taking prescription pain medicine and until you have fully recovered.  Return to your normal activities as told by your health care provider and as you can tolerate them. Ask your health care provider what activities are safe for you.  Rest and take it easy. This will help you heal and recover more quickly and fully.  Be very cautious until you have regained strength and sensation. General instructions  Have a responsible adult stay with you until you are awake and alert.  Do not drive or use heavy machinery while taking prescription pain medicine and until you have fully recovered. Ask your health care provider when it is safe to drive.  Take over-the-counter and prescription medicines only as told by your health care provider.  If you smoke, do not smoke without supervision.  Do not expose your arm or shoulder to very cold or very hot temperatures until you have full feeling back.  If you have a nerve block catheter in place: ? Try to keep the catheter from getting kinked or pinched. ? Avoid pulling or tugging on the catheter.  Keep all follow-up visits as told by your health care provider. This is important. Contact a health care provider if:  You have chills or fever.  You have redness, swelling, or pain around your injection site.  You have fluid or blood coming from the injection site.  The skin around the injection site is warm to the touch.  There is a bad smell coming from your dressing.  You have hoarseness or a drooping or dry eye that lasts more than a few days.  You have pain that is poorly controlled with the block or with pain medicine.  You have numbness, tingling, or weakness in your shoulder or arm that lasts for more  than one week. Get help right away if:  You have severe pain.  You lose or do not regain strength and sensation in your arm even after the nerve block medicine has stopped.  You have trouble breathing.  You have a nerve block catheter still in place and you begin to shiver.  You have a nerve block catheter still in place and you are getting more and more numb or weak. This information is not intended to replace advice given to you by your health care provider. Make sure you discuss any questions you have with your health care provider. Document Revised: 10/10/2017 Document Reviewed: 06/07/2016 Elsevier Patient Education  2020 [employer]  

## 2019-12-08 NOTE — H&P (Signed)
The patient has been re-examined, and the chart reviewed, and there have been no interval changes to the documented history and physical.  Plan a left shoulder scope today.  Anesthesia is consulted regarding a peripheral nerve block for post-operative pain.  The risks, benefits, and alternatives have been discussed at length, and the patient is willing to proceed.    

## 2019-12-08 NOTE — Anesthesia Procedure Notes (Signed)
Procedure Name: Intubation Date/Time: 12/08/2019 8:42 AM Performed by: Corinda Gubler, MD Pre-anesthesia Checklist: Patient identified, Patient being monitored, Timeout performed, Emergency Drugs available and Suction available Patient Re-evaluated:Patient Re-evaluated prior to induction Oxygen Delivery Method: Circle system utilized Preoxygenation: Pre-oxygenation with 100% oxygen Induction Type: IV induction Ventilation: Oral airway inserted - appropriate to patient size and Mask ventilation without difficulty Laryngoscope Size: 3 and McGraph Grade View: Grade I Tube type: Oral Tube size: 7.0 mm Number of attempts: 1 Airway Equipment and Method: Stylet Placement Confirmation: ETT inserted through vocal cords under direct vision,  positive ETCO2 and breath sounds checked- equal and bilateral Secured at: 23 cm Tube secured with: Tape Dental Injury: Teeth and Oropharynx as per pre-operative assessment  Comments: Atraumatic, easy intubation

## 2019-12-08 NOTE — Anesthesia Postprocedure Evaluation (Signed)
Anesthesia Post Note  Patient: Kenneth Erickson  Procedure(s) Performed: LEFT SHOULDER ARTHROSCOPY WITH SUBACROMIAL DECOMPRESSION, ROTATOR CUFF REPAIR, DISTAL CLAVICLE EXISION, BICEPS TENOTOMY (Left )  Patient location during evaluation: PACU Anesthesia Type: General Level of consciousness: awake and alert Pain management: pain level controlled Vital Signs Assessment: post-procedure vital signs reviewed and stable Respiratory status: spontaneous breathing, nonlabored ventilation, respiratory function stable and patient connected to nasal cannula oxygen Cardiovascular status: blood pressure returned to baseline and stable Postop Assessment: no apparent nausea or vomiting Anesthetic complications: no Comments: Advised to check blood glucose tonight, avoid sugary foods or drinks today. Patient understands.     Last Vitals:  Vitals:   12/08/19 1229 12/08/19 1240  BP:  103/67  Pulse: 97 95  Resp: 18 16  Temp:  (!) 36.1 C  SpO2: 93% 96%    Last Pain:  Vitals:   12/08/19 1240  TempSrc: Temporal  PainSc: 0-No pain                 Corinda Gubler

## 2019-12-08 NOTE — Progress Notes (Signed)
Procedure start [phone]

## 2019-12-08 NOTE — Progress Notes (Signed)
Pt CBG in PACU: 272. Dr. Suzan Slick notified. Acknowledged. Orders received.

## 2019-12-08 NOTE — Op Note (Signed)
12/08/2019  10:43 AM  PATIENT:  Kenneth Erickson  54 y.o. male  PRE-OPERATIVE DIAGNOSIS:  M75.122 complete rotator cuff tear or ruptur of left shoulder  POST-OPERATIVE DIAGNOSIS:  M75.122 complete rotator cuff tear or ruptur of left shoulder  PROCEDURE:  Procedure(s): LEFT SHOULDER ARTHROSCOPY WITH SUBACROMIAL DECOMPRESSION, ROTATOR CUFF REPAIR, DISTAL CLAVICLE EXISION, BICEPS TENOTOMY (Left)  SURGEON:  Surgeon(s) and Role:    Lovell Sheehan, MD - Primary  ASSIST: Carlynn Spry, PA-C  ANESTHESIA:   regional and general   PREOPERATIVE INDICATIONS:  Kenneth Erickson is a  54 y.o. male with a diagnosis of M75.122 complete rotator cuff tear or ruptur of left shoulder who failed conservative measures and elected for surgical management.    The risks benefits and alternatives were discussed with the patient preoperatively including but not limited to the risks of infection, bleeding, nerve injury, persistent pain or weakness, failure of the hardware, re-tear of the rotator cuff and the need for further surgery. Medical risks include DVT and pulmonary embolism, myocardial infarction, stroke, pneumonia, respiratory failure and death. Patient understood these risks and wished to proceed.  OPERATIVE IMPLANTS: Conmed medial and lateral anchors with fiber tape  OPERATIVE PROCEDURE: The patient was met in the preoperative area. The left shoulder was signed with my initials according the hospital's correct site of surgery protocol. The patient is brought to the OR and underwent a supraclavicular block and general endotracheal intubation by the anesthesia service.  The patient was placed in a beachchair position.  A spider arm positioner was used for this case. Examination under anesthesia revealed negative sulcus sign and no anterior/posterior instability.  The patient was prepped and draped in a sterile fashion. A timeout was performed to verify the patient's name, date of birth, medical record  number, correct site of surgery and correct procedure to be performed there was also used to verify the patient received antibiotics that all appropriate instruments, implants and radiographs studies were available in the room. Once all in attendance were in agreement case began.  Bony landmarks were drawn out with a surgical marker along with proposed arthroscopy incisions. These were pre-injected with 1% lidocaine plain. An 11 blade was used to establish a posterior portal through which the arthroscope was placed in the glenohumeral joint. A full diagnostic examination of the shoulder was performed.  The anterior portal was established under direct visualization with an 18-gauge spinal needle.  A 5.75 mm arthroscopic cannula was placed through the anterior portal.   The intra-articular portion of the biceps tendon was found to have a partial tear involving greater than 50% of the diameter. Therefore the decision was made to perform a tenotomy. An arthroscopic wand was used to release the biceps tendon off the superior labrum. The arthroscopic shaver was then used to debride the frayed edges of the labrum. There were no anterior or superior labral tears seen.  Subscapularis tendon was intact. Patient had a full-thickness tear involving the infra/supraspinatus without retraction. There were no loose bodies within the inferior recess and no evidence of HAGL lesion.  The arthroscope was then placed in the subacromial space. A lateral portal was then established using an 18-gauge spinal needle for localization.   The greater tuberosity was debrided using a 5.5 mm resector shaver blade to remove all remaining foreign fibers of the rotator cuff.  Debridement was performed until punctate bleeding was seen at the greater tuberosity footprint, which will allow for rotator cuff healing.  Extensive bursitis was encountered  and debrided using a 4-0 resector shaver blade and a 90 ArthroCare wand from the lateral portal.  Using a single medial anchor with fiber tape was placed. The cuff was mobilized and the tape passed through the rotator cuff. The tape was then crossed in usual fashion and fixated on the lateral side with a crossFT anchor. The final construct was stable and moved as a unit with excellent coverage of the humeral head.  Using the 4.0 mm burr and 5.5 mm burr a subacromial decompression was performed. Next using the 4.0 mm burr 8 mm of distal clavicle was resected.  All incisions were copiously irrigated. Skin closure for the arthroscopic incisions was performed with 4-0 nylon.  A dry sterile dressing including Steri-Strips was applied .  The patient was placed in an abduction sling.  All sharp and instrument counts were correct at the conclusion of the case. I was scrubbed and present for the entire case. I spoke with the patient's family in the post-op consultation room and informed them that the case had been performed without complication and the patient was stable in recovery room.   Kurtis Bushman, MD

## 2020-06-15 ENCOUNTER — Other Ambulatory Visit: Payer: Self-pay

## 2020-06-15 ENCOUNTER — Other Ambulatory Visit
Admission: RE | Admit: 2020-06-15 | Discharge: 2020-06-15 | Disposition: A | Discharge status: Home / Self Care | Payer: Worker's Compensation | Source: Ambulatory Visit | Attending: Orthopedic Surgery | Admitting: Orthopedic Surgery

## 2020-06-15 DIAGNOSIS — Z01812 Encounter for preprocedural laboratory examination: Secondary | ICD-10-CM | POA: Diagnosis not present

## 2020-06-15 DIAGNOSIS — Z20822 Contact with and (suspected) exposure to covid-19: Secondary | ICD-10-CM | POA: Insufficient documentation

## 2020-06-15 LAB — SARS CORONAVIRUS 2 (TAT 6-24 HRS): SARS Coronavirus 2: NEGATIVE

## 2020-06-16 ENCOUNTER — Encounter
Admission: RE | Admit: 2020-06-16 | Discharge: 2020-06-16 | Disposition: A | Discharge status: Home / Self Care | Payer: Managed Care, Other (non HMO) | Source: Ambulatory Visit | Attending: Orthopedic Surgery | Admitting: Orthopedic Surgery

## 2020-06-16 ENCOUNTER — Other Ambulatory Visit: Payer: Self-pay | Admitting: Orthopedic Surgery

## 2020-06-16 NOTE — Patient Instructions (Addendum)
Your procedure is scheduled on: 06-19-20 MONDAY Report to Same Day Surgery 2nd floor medical mall [facility] Entrance-take elevator on left to 2nd floor.  Check in with surgery information desk.) To find out your arrival time please call [phone] between 1PM - 3PM on 06-16-20 FRIDAY  Remember: Instructions that are not followed completely may result in serious medical risk, up to and including death, or upon the discretion of your surgeon and anesthesiologist your surgery may need to be rescheduled.    _x___ 1. Do not eat food after midnight the night before your procedure. NO GUM OR CANDY AFTER MIDNIGHT. You may drink WATER up to 2 hours before you are scheduled to arrive at the hospital for your procedure.  Do not drink WATER within 2 hours of your scheduled arrival to the hospital.  YOU MAY DRINK APPLE JUICE IF YOUR BLOOD SUGAR DROPS   Type 1 and type 2 diabetics should only drink water.    __x__ 2. No Alcohol for 24 hours before or after surgery.   __x__3. No Smoking or e-cigarettes for 24 prior to surgery.  Do not use any chewable tobacco products for at least 6 hour prior to surgery   ____  4. Bring all medications with you on the day of surgery if instructed.    __x__ 5. Notify your doctor if there is any change in your medical condition     (cold, fever, infections).    x___6. On the morning of surgery brush your teeth with toothpaste and water.  You may rinse your mouth with mouth wash if you wish.  Do not swallow any toothpaste or mouthwash.   Do not wear jewelry, make-up, hairpins, clips or nail polish.  Do not wear lotions, powders, or perfumes. You may wear deodorant.  Do not shave 48 hours prior to surgery. Men may shave face and neck.  Do not bring valuables to the hospital.    [facility] is not responsible for any belongings or valuables.               Contacts, dentures or bridgework may not be worn into surgery.  Leave your suitcase in the car. After surgery  it may be brought to your room.  For patients admitted to the hospital, discharge time is determined by your treatment team.  _  Patients discharged the day of surgery will not be allowed to drive home.  You will need someone to drive you home and stay with you the night of your procedure.    Please read over the following fact sheets that you were given:   [facility] Preparing for Surgery  _x___ TAKE THE FOLLOWING MEDICATION THE MORNING OF SURGERY WITH A SMALL SIP OF WATER. These include:  1. TAGAMET (CIMETIDINE)  2.  3.  4.  5.  6.  ____Fleets enema or Magnesium Citrate as directed.   ____ Use CHG Soap or sage wipes as directed on instruction sheet   ____ Use inhalers on the day of surgery and bring to hospital day of surgery  _X___ Stop Metformin 2 days prior to surgery-LAST DOSE TODAY 06-16-20 FRIDAY    ____ Take 1/2 of usual insulin dose the night before surgery and none on the morning surgery.   ____ Follow recommendations from Cardiologist, Pulmonologist or PCP regarding stopping Aspirin, Coumadin, Plavix ,Eliquis, Effient, or Pradaxa, and Pletal.  X____Stop Anti-inflammatories such as Advil, Aleve, Ibuprofen, Motrin, Naproxen, Naprosyn, Goodies powders or aspirin products NOW-OK to take Tylenol  _x___ Stop supplements until after surgery-STOP YOUR TURMERIC, LECITHIN AND CINNAMON NOW-YOU MAY RESUME AFTER SURGERY   ____ Bring C-Pap to the hospital.

## 2020-06-19 ENCOUNTER — Ambulatory Visit: Payer: Worker's Compensation | Admitting: Anesthesiology

## 2020-06-19 ENCOUNTER — Other Ambulatory Visit: Payer: Self-pay

## 2020-06-19 ENCOUNTER — Ambulatory Visit
Admission: RE | Admit: 2020-06-19 | Discharge: 2020-06-19 | Disposition: A | Discharge status: Home / Self Care | Payer: Worker's Compensation | Attending: Orthopedic Surgery | Admitting: Orthopedic Surgery

## 2020-06-19 ENCOUNTER — Encounter: Payer: Self-pay | Admitting: Orthopedic Surgery

## 2020-06-19 ENCOUNTER — Encounter
Admission: RE | Disposition: A | Discharge status: Home / Self Care | Payer: Self-pay | Source: Home / Self Care | Attending: Orthopedic Surgery

## 2020-06-19 ENCOUNTER — Ambulatory Visit: Payer: Worker's Compensation

## 2020-06-19 DIAGNOSIS — M7502 Adhesive capsulitis of left shoulder: Secondary | ICD-10-CM | POA: Diagnosis not present

## 2020-06-19 DIAGNOSIS — I10 Essential (primary) hypertension: Secondary | ICD-10-CM | POA: Diagnosis not present

## 2020-06-19 DIAGNOSIS — Z833 Family history of diabetes mellitus: Secondary | ICD-10-CM | POA: Insufficient documentation

## 2020-06-19 DIAGNOSIS — M9689 Other intraoperative and postprocedural complications and disorders of the musculoskeletal system: Secondary | ICD-10-CM | POA: Diagnosis not present

## 2020-06-19 DIAGNOSIS — M65812 Other synovitis and tenosynovitis, left shoulder: Secondary | ICD-10-CM | POA: Insufficient documentation

## 2020-06-19 DIAGNOSIS — M25512 Pain in left shoulder: Secondary | ICD-10-CM | POA: Insufficient documentation

## 2020-06-19 DIAGNOSIS — E119 Type 2 diabetes mellitus without complications: Secondary | ICD-10-CM | POA: Diagnosis not present

## 2020-06-19 DIAGNOSIS — Z8249 Family history of ischemic heart disease and other diseases of the circulatory system: Secondary | ICD-10-CM | POA: Diagnosis not present

## 2020-06-19 DIAGNOSIS — M25612 Stiffness of left shoulder, not elsewhere classified: Secondary | ICD-10-CM | POA: Insufficient documentation

## 2020-06-19 DIAGNOSIS — Z9889 Other specified postprocedural states: Secondary | ICD-10-CM | POA: Diagnosis not present

## 2020-06-19 DIAGNOSIS — Y990 Civilian activity done for income or pay: Secondary | ICD-10-CM | POA: Diagnosis not present

## 2020-06-19 DIAGNOSIS — Y838 Other surgical procedures as the cause of abnormal reaction of the patient, or of later complication, without mention of misadventure at the time of the procedure: Secondary | ICD-10-CM | POA: Insufficient documentation

## 2020-06-19 DIAGNOSIS — M7592 Shoulder lesion, unspecified, left shoulder: Secondary | ICD-10-CM | POA: Diagnosis not present

## 2020-06-19 DIAGNOSIS — Z419 Encounter for procedure for purposes other than remedying health state, unspecified: Secondary | ICD-10-CM

## 2020-06-19 HISTORY — PX: SHOULDER ARTHROSCOPY: SHX128

## 2020-06-19 LAB — POCT I-STAT, CHEM 8
BUN: 12 mg/dL (ref 6–20)
Calcium, Ion: 1.2 mmol/L (ref 1.15–1.40)
Chloride: 105 mmol/L (ref 98–111)
Creatinine, Ser: 0.9 mg/dL (ref 0.61–1.24)
Glucose, Bld: 69 mg/dL — ABNORMAL LOW (ref 70–99)
HCT: 42 % (ref 39.0–52.0)
Hemoglobin: 14.3 g/dL (ref 13.0–17.0)
Potassium: 3.6 mmol/L (ref 3.5–5.1)
Sodium: 143 mmol/L (ref 135–145)
TCO2: 24 mmol/L (ref 22–32)

## 2020-06-19 LAB — GLUCOSE, CAPILLARY
Glucose-Capillary: 62 mg/dL — ABNORMAL LOW (ref 70–99)
Glucose-Capillary: 62 mg/dL — ABNORMAL LOW (ref 70–99)
Glucose-Capillary: 76 mg/dL (ref 70–99)

## 2020-06-19 SURGERY — ARTHROSCOPY, SHOULDER
Anesthesia: General | Site: Shoulder | Laterality: Left

## 2020-06-19 MED ORDER — PROPOFOL 10 MG/ML IV BOLUS
INTRAVENOUS | Status: AC
  Filled 2020-06-19: qty 20

## 2020-06-19 MED ORDER — BUPIVACAINE-EPINEPHRINE (PF) 0.25% -1:200000 IJ SOLN
INTRAMUSCULAR | Status: AC
  Filled 2020-06-19: qty 30

## 2020-06-19 MED ORDER — HYDROCODONE-ACETAMINOPHEN 5-325 MG PO TABS
1.0000 | ORAL_TABLET | ORAL | 0 refills | Status: AC | PRN
Start: 2020-06-19 — End: 2021-06-19

## 2020-06-19 MED ORDER — FENTANYL CITRATE (PF) 100 MCG/2ML IJ SOLN
INTRAMUSCULAR | Status: AC
  Filled 2020-06-19: qty 2

## 2020-06-19 MED ORDER — PROPOFOL 10 MG/ML IV BOLUS
INTRAVENOUS | Status: DC | PRN
  Administered 2020-06-19: 200 mg via INTRAVENOUS

## 2020-06-19 MED ORDER — ROCURONIUM BROMIDE 100 MG/10ML IV SOLN
INTRAVENOUS | Status: DC | PRN
  Administered 2020-06-19: 50 mg via INTRAVENOUS

## 2020-06-19 MED ORDER — ONDANSETRON HCL 4 MG PO TABS: 4.0000 mg | ORAL_TABLET | Freq: Four times a day (QID) | ORAL | Status: DC | PRN

## 2020-06-19 MED ORDER — KETOROLAC TROMETHAMINE 30 MG/ML IJ SOLN
INTRAMUSCULAR | Status: DC | PRN
  Administered 2020-06-19: 30 mg via INTRAVENOUS

## 2020-06-19 MED ORDER — MIDAZOLAM HCL 2 MG/2ML IJ SOLN
INTRAMUSCULAR | Status: DC | PRN
  Administered 2020-06-19: 2 mg via INTRAVENOUS

## 2020-06-19 MED ORDER — DEXAMETHASONE SODIUM PHOSPHATE 10 MG/ML IJ SOLN
INTRAMUSCULAR | Status: AC
  Filled 2020-06-19: qty 1

## 2020-06-19 MED ORDER — DOCUSATE SODIUM 100 MG PO CAPS: 100.0000 mg | ORAL_CAPSULE | Freq: Every day | ORAL | 2 refills | Status: AC | PRN

## 2020-06-19 MED ORDER — FENTANYL CITRATE (PF) 100 MCG/2ML IJ SOLN
INTRAMUSCULAR | Status: AC
  Administered 2020-06-19: 50 ug
  Filled 2020-06-19: qty 2

## 2020-06-19 MED ORDER — CEFAZOLIN SODIUM-DEXTROSE 2-4 GM/100ML-% IV SOLN
INTRAVENOUS | Status: AC
  Filled 2020-06-19: qty 100

## 2020-06-19 MED ORDER — ONDANSETRON HCL 4 MG/2ML IJ SOLN: 4.0000 mg | Freq: Once | INTRAMUSCULAR | Status: DC | PRN

## 2020-06-19 MED ORDER — EPINEPHRINE PF 1 MG/ML IJ SOLN
INTRAMUSCULAR | Status: AC
  Filled 2020-06-19: qty 2

## 2020-06-19 MED ORDER — BUPIVACAINE HCL (PF) 0.5 % IJ SOLN
INTRAMUSCULAR | Status: AC
  Filled 2020-06-19: qty 10

## 2020-06-19 MED ORDER — KETOROLAC TROMETHAMINE 30 MG/ML IJ SOLN
INTRAMUSCULAR | Status: AC
  Filled 2020-06-19: qty 1

## 2020-06-19 MED ORDER — BUPIVACAINE HCL (PF) 0.5 % IJ SOLN
INTRAMUSCULAR | Status: DC | PRN
  Administered 2020-06-19: 20 mL

## 2020-06-19 MED ORDER — LIDOCAINE HCL (PF) 2 % IJ SOLN
INTRAMUSCULAR | Status: AC
  Filled 2020-06-19: qty 10

## 2020-06-19 MED ORDER — METOCLOPRAMIDE HCL 10 MG PO TABS: 5.0000 mg | ORAL_TABLET | Freq: Three times a day (TID) | ORAL | Status: DC | PRN

## 2020-06-19 MED ORDER — MIDAZOLAM HCL 2 MG/2ML IJ SOLN
INTRAMUSCULAR | Status: AC
  Filled 2020-06-19: qty 2

## 2020-06-19 MED ORDER — CEFAZOLIN SODIUM-DEXTROSE 2-4 GM/100ML-% IV SOLN
2.0000 g | INTRAVENOUS | Status: AC
  Administered 2020-06-19: 2 g via INTRAVENOUS

## 2020-06-19 MED ORDER — FENTANYL CITRATE (PF) 100 MCG/2ML IJ SOLN: 25.0000 ug | INTRAMUSCULAR | Status: DC | PRN

## 2020-06-19 MED ORDER — PHENYLEPHRINE HCL (PRESSORS) 10 MG/ML IV SOLN
INTRAVENOUS | Status: DC | PRN
  Administered 2020-06-19 (×2): 150 ug via INTRAVENOUS
  Administered 2020-06-19 (×2): 100 ug via INTRAVENOUS
  Administered 2020-06-19: 150 ug via INTRAVENOUS

## 2020-06-19 MED ORDER — MIDAZOLAM HCL 2 MG/2ML IJ SOLN
INTRAMUSCULAR | Status: AC
  Administered 2020-06-19: 1 mg
  Filled 2020-06-19: qty 2

## 2020-06-19 MED ORDER — ONDANSETRON HCL 4 MG/2ML IJ SOLN: 4.0000 mg | Freq: Four times a day (QID) | INTRAMUSCULAR | Status: DC | PRN

## 2020-06-19 MED ORDER — ONDANSETRON HCL 4 MG/2ML IJ SOLN
INTRAMUSCULAR | Status: DC | PRN
  Administered 2020-06-19: 4 mg via INTRAVENOUS

## 2020-06-19 MED ORDER — OXYCODONE HCL 5 MG/5ML PO SOLN: 5.0000 mg | Freq: Once | ORAL | Status: DC | PRN

## 2020-06-19 MED ORDER — LACTATED RINGERS IV SOLN: INTRAVENOUS | Status: DC

## 2020-06-19 MED ORDER — OXYCODONE HCL 5 MG PO TABS: 5.0000 mg | ORAL_TABLET | Freq: Once | ORAL | Status: DC | PRN

## 2020-06-19 MED ORDER — BUPIVACAINE-EPINEPHRINE (PF) 0.25% -1:200000 IJ SOLN
INTRAMUSCULAR | Status: DC | PRN
  Administered 2020-06-19: 30 mL

## 2020-06-19 MED ORDER — METOCLOPRAMIDE HCL 5 MG/ML IJ SOLN: 5.0000 mg | Freq: Three times a day (TID) | INTRAMUSCULAR | Status: DC | PRN

## 2020-06-19 MED ORDER — SUGAMMADEX SODIUM 200 MG/2ML IV SOLN
INTRAVENOUS | Status: DC | PRN
  Administered 2020-06-19: 200 mg via INTRAVENOUS

## 2020-06-19 MED ORDER — DEXAMETHASONE SODIUM PHOSPHATE 10 MG/ML IJ SOLN
INTRAMUSCULAR | Status: DC | PRN
  Administered 2020-06-19: 5 mg via INTRAVENOUS

## 2020-06-19 MED ORDER — ACETAMINOPHEN 10 MG/ML IV SOLN: 1000.0000 mg | Freq: Once | INTRAVENOUS | Status: DC | PRN

## 2020-06-19 MED ORDER — CHLORHEXIDINE GLUCONATE 0.12 % MT SOLN
OROMUCOSAL | Status: AC
  Administered 2020-06-19: 15 mL via OROMUCOSAL
  Filled 2020-06-19: qty 15

## 2020-06-19 MED ORDER — SODIUM CHLORIDE 0.9 % IV SOLN
INTRAVENOUS | Status: DC | PRN
  Administered 2020-06-19: 30 ug/min via INTRAVENOUS

## 2020-06-19 MED ORDER — SODIUM CHLORIDE 0.9 % IV SOLN: INTRAVENOUS | Status: DC

## 2020-06-19 MED ORDER — LACTATED RINGERS IV SOLN
INTRAVENOUS | Status: DC | PRN
  Administered 2020-06-19: 2 mL

## 2020-06-19 MED ORDER — CHLORHEXIDINE GLUCONATE 0.12 % MT SOLN: 15.0000 mL | Freq: Once | OROMUCOSAL | Status: AC

## 2020-06-19 MED ORDER — FENTANYL CITRATE (PF) 100 MCG/2ML IJ SOLN
INTRAMUSCULAR | Status: DC | PRN
Start: 2020-06-19 — End: 2020-06-19
  Administered 2020-06-19 (×2): 50 ug via INTRAVENOUS

## 2020-06-19 MED ORDER — LIDOCAINE HCL (CARDIAC) PF 100 MG/5ML IV SOSY
PREFILLED_SYRINGE | INTRAVENOUS | Status: DC | PRN
  Administered 2020-06-19: 100 mg via INTRAVENOUS

## 2020-06-19 MED ORDER — ORAL CARE MOUTH RINSE: 15.0000 mL | Freq: Once | OROMUCOSAL | Status: AC

## 2020-06-19 SURGICAL SUPPLY — 52 items
ADAPTER IRRIG TUBE 2 SPIKE SOL (ADAPTER) ×6 IMPLANT
ADPR TBG 2 SPK PMP STRL ASCP (ADAPTER) ×2
BLADE FULL RADIUS 3.5 (BLADE) ×3 IMPLANT
BLADE INCISOR PLUS 4.5 (BLADE) ×3 IMPLANT
BLADE SURG MINI STRL (BLADE) IMPLANT
BRUSH SCRUB EZ  4% CHG (MISCELLANEOUS) ×2
BRUSH SCRUB EZ 4% CHG (MISCELLANEOUS) ×1 IMPLANT
BUR ACROMIONIZER 4.0 (BURR) IMPLANT
BUR BR 5.5 WIDE MOUTH (BURR) ×3 IMPLANT
CANNULA 5.75X7 CRYSTAL CLEAR (CANNULA) ×3 IMPLANT
CANNULA PARTIAL THREAD 2X7 (CANNULA) ×3 IMPLANT
CANNULA SHOULDER 7CM (CANNULA) ×3 IMPLANT
CANNULA TWIST IN 8.25X9CM (CANNULA) ×3 IMPLANT
COOLER POLAR GLACIER W/PUMP (MISCELLANEOUS) ×3 IMPLANT
COVER WAND RF STERILE (DRAPES) ×3 IMPLANT
DRAPE 3/4 80X56 (DRAPES) ×3 IMPLANT
DRAPE INCISE IOBAN 66X45 STRL (DRAPES) ×3 IMPLANT
DRAPE STERI 35X30 U-POUCH (DRAPES) ×3 IMPLANT
DRAPE U-SHAPE 47X51 STRL (DRAPES) ×3 IMPLANT
GAUZE SPONGE 4X4 12PLY STRL (GAUZE/BANDAGES/DRESSINGS) ×3 IMPLANT
GAUZE XEROFORM 1X8 LF (GAUZE/BANDAGES/DRESSINGS) ×3 IMPLANT
GLOVE BIOGEL PI IND STRL 8 (GLOVE) ×1 IMPLANT
GLOVE BIOGEL PI INDICATOR 8 (GLOVE) ×2
GLOVE SURG ORTHO 8.0 STRL STRW (GLOVE) ×3 IMPLANT
GOWN STRL REUS W/ TWL LRG LVL3 (GOWN DISPOSABLE) ×2 IMPLANT
GOWN STRL REUS W/TWL LRG LVL3 (GOWN DISPOSABLE) ×6
IV LACTATED RINGER IRRG 3000ML (IV SOLUTION) ×9
IV LR IRRIG 3000ML ARTHROMATIC (IV SOLUTION) ×3 IMPLANT
KIT STABILIZATION SHOULDER (MISCELLANEOUS) ×3 IMPLANT
KIT TURNOVER KIT A (KITS) ×3 IMPLANT
MANIFOLD NEPTUNE II (INSTRUMENTS) ×3 IMPLANT
MASK FACE SPIDER DISP (MASK) ×3 IMPLANT
MAT ABSORB  FLUID 56X50 GRAY (MISCELLANEOUS) ×4
MAT ABSORB FLUID 56X50 GRAY (MISCELLANEOUS) ×2 IMPLANT
NEEDLE HYPO 22GX1.5 SAFETY (NEEDLE) ×3 IMPLANT
NEEDLE SPNL 18GX3.5 QUINCKE PK (NEEDLE) ×3 IMPLANT
PACK SHDR ARTHRO (MISCELLANEOUS) ×3 IMPLANT
PAD ABD DERMACEA PRESS 5X9 (GAUZE/BANDAGES/DRESSINGS) IMPLANT
PAD ARMBOARD 7.5X6 YLW CONV (MISCELLANEOUS) ×6 IMPLANT
PAD WRAPON POLAR SHDR XLG (MISCELLANEOUS) ×1 IMPLANT
SLING ARM LRG DEEP (SOFTGOODS) ×3 IMPLANT
SLING ULTRA II LG (MISCELLANEOUS) ×3 IMPLANT
STRAP SAFETY 5IN WIDE (MISCELLANEOUS) ×3 IMPLANT
SUT ETHILON NAB PS2 4-0 18IN (SUTURE) ×3 IMPLANT
SYR 10ML LL (SYRINGE) ×3 IMPLANT
SYR 50ML LL SCALE MARK (SYRINGE) ×3 IMPLANT
TAPE MICROFOAM 4IN (TAPE) ×3 IMPLANT
TUBING ARTHRO INFLOW-ONLY STRL (TUBING) ×3 IMPLANT
TUBING CONNECTING 10 (TUBING) ×2 IMPLANT
TUBING CONNECTING 10' (TUBING) ×1
WAND WEREWOLF FLOW 90D (MISCELLANEOUS) ×3 IMPLANT
WRAPON POLAR PAD SHDR XLG (MISCELLANEOUS) ×3

## 2020-06-19 NOTE — Op Note (Signed)
06/19/2020  4:46 PM  PATIENT:  Kenneth Erickson    PRE-OPERATIVE DIAGNOSIS:  M25.612 Stiffness of left shoulder, not elsewhere classified Z98.890 Other specified postprocedural states  POST-OPERATIVE DIAGNOSIS:  Same  PROCEDURE:  SHOULDER ARTHROSCOPY WITH LYSIS AND RESECTION OF ADHESIONS, MANIPULATION, LEFT  SURGEON:  Lovell Sheehan, MD   ASSIST: Carlynn Spry, PA-C  ANESTHESIA:   General  PREOPERATIVE INDICATIONS:  [name] is a  54 y.o. male with a diagnosis of M25.612 Stiffness of left shoulder, not elsewhere classified Z98.890 Other specified postprocedural states who failed conservative measures and elected for surgical management.    I discussed the risks and benefits of surgery. The risks include but are not limited to infection, bleeding requiring blood transfusion, nerve or blood vessel injury, joint stiffness or loss of motion, persistent pain, weakness or instability, malunion, nonunion and hardware failure and the need for further surgery. Medical risks include but are not limited to DVT and pulmonary embolism, myocardial infarction, stroke, pneumonia, respiratory failure and death. Patient understood these risks and wished to proceed.   OPERATIVE FINDINGS: The shoulder lacked full forward flexion and abduction. Once adequate anesthesia was achieved the shoulder was manipulated in to a full range of motion. Arthroscopic evaluation showed significant scar in the subacromial space and rotator interval. The rotator cuff was intact with small articular sided tearing less than 10% of the tendon thickness. The labrum and cartilage were intact.    OPERATIVE PROCEDURE: The patient was met in the preoperative area. The left shoulder was signed with the word yes and my initials according the hospital's correct site of surgery protocol.  History and physical was updated. Patient was brought to the operating room where he underwent interscalene block and general anesthesia. The  patient was placed in a beachchair position.  A spider arm positioner was used for this case. The patient had a negative sulcus sign. The shoulder was gently manipulated through a full range of motion.  Patient was prepped and draped in a sterile fashion. A timeout was performed to verify the patient's name, date of birth, medical record number, correct site of surgery and correct procedure to be performed there was also used to verify the patient received antibiotics that all appropriate instruments, implants and radiographs studies were available in the room. Once all in attendance were in agreement case began.  Bony landmarks were drawn out with a surgical marker along with proposed arthroscopy incisions. These were pre-injected with 1% lidocaine plain. An 11 blade was used to establish a posterior portal through which the arthroscope was placed in the glenohumeral joint. A full diagnostic examination of the shoulder was performed. Please see findings for a complete report. The rotator interval was opened with electrocautery. Synovitis was debrided with the shaver.  The arthroscope was then placed in the subacromial space.  Extensive scar was encountered. A lateral portal was established with an 18-gauge spinal needle for localization. A 90 ArthroCare wand and shaver blade were used to form an extensive subdeltoid and subacromial bursectomy. There was no evidence of a bursal sided tear of the supra or infraspinatus.   A subacromial decompression was performed using a 5.5 mm Acromionizer from the lateral portal. Final arthroscopic images were taken. Arthroscopic images were then removed.  Skin closure for the arthroscopic incisions was performed with 4-0 nylon.   0.25% Marcaine plain was then injected into the subacromial space for postoperative pain control. A dry sterile dressing was applied.  The patient was placed in a  sling.  All sharp and it instrument counts were correct at the conclusion of the  case. I was scrubbed and present for the entire case. I spoke with the patient's family postoperatively to let them know the case had been performed without complication and the patient was stable in recovery room.

## 2020-06-19 NOTE — H&P (Signed)
The patient has been re-examined, and the chart reviewed, and there have been no interval changes to the documented history and physical.  Plan a left shoulder scope with manipulation under anesthesia today.  Anesthesia is consulted regarding a peripheral nerve block for post-operative pain.  The risks, benefits, and alternatives have been discussed at length, and the patient is willing to proceed.

## 2020-06-19 NOTE — Transfer of Care (Signed)
Immediate Anesthesia Transfer of Care Note  Patient: Kenneth Erickson  Procedure(s) Performed: SHOULDER ARTHROSCOPY WITH LYSIS AND RESECTION OF ADHESIONS (Left Shoulder)  Patient Location: PACU  Anesthesia Type:General  Level of Consciousness: awake and drowsy  Airway & Oxygen Therapy: Patient Spontanous Breathing and Patient connected to face mask oxygen  Post-op Assessment: Report given to RN and Post -op Vital signs reviewed and stable  Post vital signs: Reviewed and stable  Last Vitals:  Vitals Value Taken Time  BP 144/90 06/19/20 1654  Temp 35.8 C 06/19/20 1654  Pulse 91 06/19/20 1657  Resp 15 06/19/20 1657  SpO2 98 % 06/19/20 1657  Vitals shown include unvalidated device data.  Last Pain:  Vitals:   06/19/20 1225  TempSrc: Temporal  PainSc: 0-No pain         Complications: No complications documented.

## 2020-06-19 NOTE — Anesthesia Preprocedure Evaluation (Signed)
Anesthesia Evaluation  Patient identified by MRN, date of birth, ID band Patient awake    Reviewed: Allergy & Precautions, NPO status , Patient's Chart, lab work & pertinent test results  History of Anesthesia Complications Negative for: history of anesthetic complications  Airway Mallampati: II  TM Distance: >3 FB Neck ROM: Full    Dental no notable dental hx. (+) Teeth Intact, Chipped, Dental Advisory Given,    Pulmonary neg pulmonary ROS, neg sleep apnea, neg COPD, Patient abstained from smoking.Not current smoker,    Pulmonary exam normal breath sounds clear to auscultation       Cardiovascular Exercise Tolerance: Good METShypertension, Pt. 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[employer], [employer] distribution center, very active   Neuro/Psych negative neurological ROS  negative psych ROS   GI/Hepatic GERD  Controlled and Medicated,(+)     (-) substance abuse  ,   Endo/Other  diabetes, Type 2  Renal/GU negative Renal ROS     Musculoskeletal   Abdominal   Peds  Hematology   Anesthesia Other Findings Past Medical History: No date: Diabetes mellitus without complication (HCC) No date: GERD (gastroesophageal reflux disease) No date: History of kidney stones No date: Hypertension No date: Pneumonia  Reproductive/Obstetrics                             Anesthesia Physical  Anesthesia Plan  ASA: II  Anesthesia Plan: General   Post-op Pain Management:  Regional for Post-op pain   Induction: Intravenous  PONV Risk Score and Plan: 3 and Ondansetron, Dexamethasone and Midazolam  Airway Management Planned: Oral ETT  Additional Equipment: None  Intra-op Plan:   Post-operative Plan: Extubation in OR  Informed Consent: I have reviewed the patients History and Physical, chart, labs and discussed the procedure including  the risks, benefits and alternatives for the proposed anesthesia with the patient or authorized representative who has indicated his/her understanding and acceptance.     Dental advisory given  Plan Discussed with: CRNA and Surgeon  Anesthesia Plan Comments: (Discussed risks of anesthesia with patient, including PONV, sore throat, lip/dental damage. Rare risks discussed as well, such as cardiorespiratory sequelae. Patient understands.  Discussed r/b/a of interscalene block, including elective nature. Risks discussed: - Rare: bleeding, infection, nerve damage - shortness of breath from hemidiaphragmatic paralysis - unilateral horner's syndrome - poor/non-working blocks Patient understands and agrees. Spoke with Dr Odis Luster and patient regarding block; patient has physical therapy appt on Wednesday, which would make exparel a poor choice for the block. Will proceed with interscalene block with 0.5% bupivacaine for shorter duration of action. Surgeon and patient agreeable. )        Anesthesia Quick Evaluation

## 2020-06-19 NOTE — Progress Notes (Signed)
Pt ISTAT glucose reading 69. Pt glucose rechecked via POCT finger glucose test and read 62. Notified Dr. Suzan Slick. No new orders received. Pt again confirms asymptomatic. CRNA notified of above. Pt in no acute distress.

## 2020-06-19 NOTE — Anesthesia Procedure Notes (Signed)
Anesthesia Regional Block: Interscalene brachial plexus block   Pre-Anesthetic Checklist: ,, timeout performed, Correct Patient, Correct Site, Correct Laterality, Correct Procedure, Correct Position, site marked, Risks and benefits discussed,  Surgical consent,  Pre-op evaluation,  At surgeon's request and post-op pain management  Laterality: Left  Prep: chloraprep       Needles:  Injection technique: Single-shot  Needle Type: Echogenic Needle     Needle Length: 4cm  Needle Gauge: 25     Additional Needles:   Narrative:  Start time: 06/19/2020 2:03 PM End time: 06/19/2020 2:07 PM Injection made incrementally with aspirations every 5 mL.  Performed by: Personally  Anesthesiologist: Corinda Gubler, MD  Additional Notes: Patient consented for risk and benefits of nerve block including but not limited to: nerve damage, failed block, bleeding and infection, hemidiaphragmatic paralysis and shortness of breath, Horner's syndrome.  Patient voiced understanding.  Functioning IV was confirmed and monitors were applied.  Sterile prep,hand hygiene and sterile gloves were used.  Minimal sedation used for procedure.   No paresthesia endorsed by patient during the procedure.  Negative aspiration and negative test dose prior to incremental administration of local anesthetic. The patient tolerated the procedure well with no immediate complications.

## 2020-06-19 NOTE — Anesthesia Procedure Notes (Signed)
Procedure Name: Intubation Date/Time: 06/19/2020 3:21 PM Performed by: Elmarie Mainland, CRNA Pre-anesthesia Checklist: Patient identified, Emergency Drugs available, Suction available and Patient being monitored Patient Re-evaluated:Patient Re-evaluated prior to induction Oxygen Delivery Method: Circle system utilized Preoxygenation: Pre-oxygenation with 100% oxygen Induction Type: IV induction Ventilation: Two handed mask ventilation required and Oral airway inserted - appropriate to patient size Laryngoscope Size: McGraph and 4 Grade View: Grade I Tube type: Oral Tube size: 7.0 mm Number of attempts: 1 Airway Equipment and Method: Stylet and Video-laryngoscopy Placement Confirmation: ETT inserted through vocal cords under direct vision,  positive ETCO2 and breath sounds checked- equal and bilateral Secured at: 21 cm Tube secured with: Tape Dental Injury: Teeth and Oropharynx as per pre-operative assessment

## 2020-06-19 NOTE — Discharge Instructions (Addendum)
Wear sling for comfort.  Keep the dressing dry.  You may remove bandage in 3 days.  You may place Band-Aids over top of the incisions.  May shower once dressing is removed in 3 days.  Remove sling carefully only for showers, leaving arm down by your side while in the shower.  +++ Make sure to take some pain medication this evening before you fall asleep, in preparation for the nerve block wearing off in the middle of the night.  If the the pain medication causes itching, or is too strong, try taking a single tablet at a time, or combining with Benadryl.  You may be most comfortable sleeping in a recliner.  If you do sleep in near bed, placed pillows behind the shoulder that have the operation to support it.   Interscalene Nerve Block, Care After This sheet gives you information about how to care for yourself after your procedure. Your health care provider may also give you more specific instructions. If you have problems or questions, contact your health care provider. What can I expect after the procedure? After the procedure, it is common to have:  Soreness or tenderness in your neck.  Numbness in your shoulder, upper arm, and some fingers.  Weakness in your shoulder and arm muscles. The feeling and strength in your shoulder, arm, and fingers should return to normal within hours after your procedure. Follow these instructions at home: For at least 24 hours after the procedure:  Do not: ? Participate in activities in which you could fall or become injured. ? Drive. ? Use heavy machinery. ? Drink alcohol. ? Take sleeping pills or medicines that cause drowsiness. ? Make important decisions or sign legal documents. ? Take care of children on your own.  Rest. Eating and drinking  If you vomit, drink water, juice, or soup when you can drink without vomiting.  Make sure you have little or no nausea before eating solid foods.  Follow the diet that is recommended by your health  care provider. If you have a sling:  Wear it as told by your health care provider. Remove it only as told by your health care provider.  Loosen the sling if your fingers tingle, become numb, or turn cold and blue.  Make sure that your entire arm, including your wrist, is supported. Do not allow your wrist to dangle over the end of the sling.  Do not let your sling get wet if it is not waterproof.  Keep the sling clean. Bathing  Do not take baths, swim, or use a hot tub until your health care provider approves.  If you have a nerve block catheter in place, keep the incision site and tubing dry. Injection site care   Wash your hands with soap and water before you change your bandage (dressing). If soap and water are not available, use hand sanitizer.  Change your dressing as told by your health care provider.  Keep your dressing dry.  Check your nerve block injection site every day for signs of infection. Check for: ? Redness, swelling, or pain. ? Fluid or blood. ? Warmth. Activity  Do not perform complex or risky activities while taking prescription pain medicine and until you have fully recovered.  Return to your normal activities as told by your health care provider and as you can tolerate them. Ask your health care provider what activities are safe for you.  Rest and take it easy. This will help you heal and recover more quickly  and fully.  Be very cautious until you have regained strength and sensation. General instructions  Have a responsible adult stay with you until you are awake and alert.  Do not drive or use heavy machinery while taking prescription pain medicine and until you have fully recovered. Ask your health care provider when it is safe to drive.  Take over-the-counter and prescription medicines only as told by your health care provider.  If you smoke, do not smoke without supervision.  Do not expose your arm or shoulder to very cold or very hot  temperatures until you have full feeling back.  If you have a nerve block catheter in place: ? Try to keep the catheter from getting kinked or pinched. ? Avoid pulling or tugging on the catheter.  Keep all follow-up visits as told by your health care provider. This is important. Contact a health care provider if:  You have chills or fever.  You have redness, swelling, or pain around your injection site.  You have fluid or blood coming from the injection site.  The skin around the injection site is warm to the touch.  There is a bad smell coming from your dressing.  You have hoarseness or a drooping or dry eye that lasts more than a few days.  You have pain that is poorly controlled with the block or with pain medicine.  You have numbness, tingling, or weakness in your shoulder or arm that lasts for more than one week. Get help right away if:  You have severe pain.  You lose or do not regain strength and sensation in your arm even after the nerve block medicine has stopped.  You have trouble breathing.  You have a nerve block catheter still in place and you begin to shiver.  You have a nerve block catheter still in place and you are getting more and more numb or weak. This information is not intended to replace advice given to you by your health care provider. Make sure you discuss any questions you have with your health care provider. Document Revised: 10/10/2017 Document Reviewed: 06/07/2016 Elsevier Patient Education  2020 [employer]     AMBULATORY SURGERY  DISCHARGE INSTRUCTIONS   1) The drugs that you were given will stay in your system until tomorrow so for the next 24 hours you should not:  A) Drive an automobile B) Make any legal decisions C) Drink any alcoholic beverage   2) You may resume regular meals tomorrow.  Today it is better to start with liquids and gradually work up to solid foods.  You may eat anything you prefer, but it is better to start  with liquids, then soup and crackers, and gradually work up to solid foods.   3) Please notify your doctor immediately if you have any unusual bleeding, trouble breathing, redness and pain at the surgery site, drainage, fever, or pain not relieved by medication.    4) Additional Instructions:        Please contact your physician with any problems or Same Day Surgery at [phone], Monday through Friday 6 am to 4 pm, or [place] at [facility] number at [phone].

## 2020-06-21 NOTE — Anesthesia Postprocedure Evaluation (Signed)
Anesthesia Post Note  Patient: Kenneth Erickson  Procedure(s) Performed: SHOULDER ARTHROSCOPY WITH LYSIS AND RESECTION OF ADHESIONS (Left Shoulder)  Patient location during evaluation: PACU Anesthesia Type: General Level of consciousness: awake and alert Pain management: pain level controlled Vital Signs Assessment: post-procedure vital signs reviewed and stable Respiratory status: spontaneous breathing, nonlabored ventilation, respiratory function stable and patient connected to nasal cannula oxygen Cardiovascular status: blood pressure returned to baseline and stable Postop Assessment: no apparent nausea or vomiting Anesthetic complications: no   No complications documented.   Last Vitals:  Vitals:   06/19/20 1729 06/19/20 1738  BP:  (!) 143/94  Pulse: 86 82  Resp: 15 16  Temp:  36.4 C  SpO2: 94% 96%    Last Pain:  Vitals:   06/20/20 0852  TempSrc:   PainSc: 3                  Corinda Gubler

## 2020-06-28 NOTE — H&P (Signed)
Kenneth Erickson MRN:  [id number] DOB/SEX:  1965/12/23/male  CHIEF COMPLAINT:  Left shoulder stiffness  HISTORY:  Patient complains of left shoulder pain. The symptoms began several months ago. Aggravating factors: work related injury. Pain is located around the acromioclavicular [facility]) joint  The patient had a rotator cuff repair and has experienced significant stiffness and decreased range of motion.    PAST MEDICAL HISTORY: There are no problems to display for this patient.  Past Medical History:  Diagnosis Date  . Diabetes mellitus without complication (HCC)   . GERD (gastroesophageal reflux disease)   . History of kidney stones   . Hypertension   . Pneumonia    Past Surgical History:  Procedure Laterality Date  . CATARACT EXTRACTION W/PHACO Right 06/19/2015   Procedure: CATARACT EXTRACTION PHACO AND INTRAOCULAR LENS PLACEMENT (IOC);  Surgeon: Sallee Lange, MD;  Location: [facility] ORS;  Service: Ophthalmology;  Laterality: Right;  [place]: 00:12.0 AP%: 23.7 CDE: 7.13 Lot # [id number]  . CATARACT EXTRACTION W/PHACO Left 09/11/2015   Procedure: CATARACT EXTRACTION PHACO AND INTRAOCULAR LENS PLACEMENT (IOC);  Surgeon: Sallee Lange, MD;  Location: [facility] ORS;  Service: Ophthalmology;  Laterality: Left;  [place] 11.1 AP% 52.2 CDE 5.79 fluid pack lot # [id number]  . EYE SURGERY    . SHOULDER ARTHROSCOPY Left 06/19/2020   Procedure: SHOULDER ARTHROSCOPY WITH LYSIS AND RESECTION OF ADHESIONS;  Surgeon: Lyndle Herrlich, MD;  Location: [facility] ORS;  Service: Orthopedics;  Laterality: Left;  . SHOULDER ARTHROSCOPY WITH SUBACROMIAL DECOMPRESSION, ROTATOR CUFF REPAIR AND BICEP TENDON REPAIR Left 12/08/2019   Procedure: LEFT SHOULDER ARTHROSCOPY WITH SUBACROMIAL DECOMPRESSION, ROTATOR CUFF REPAIR, DISTAL CLAVICLE EXISION, BICEPS TENOTOMY;  Surgeon: Lyndle Herrlich, MD;  Location: [facility] ORS;  Service: Orthopedics;  Laterality: Left;  . TONSILLECTOMY       MEDICATIONS:   No medications prior to  admission.    ALLERGIES:  No Known Allergies  REVIEW OF SYSTEMS:  Pertinent items are noted in HPI.   FAMILY HISTORY:   Family History  Problem Relation Age of Onset  . Heart attack Mother   . Diabetes Mother   . Breast cancer Mother     SOCIAL HISTORY:   Social History   Tobacco Use  . Smoking status: Never Smoker  . Smokeless tobacco: Never Used  Substance Use Topics  . Alcohol use: Not Currently     EXAMINATION:  Vital signs in last 24 hours:    General appearance: alert, cooperative and no distress Neck: no JVD and supple, symmetrical, trachea midline Lungs: clear to auscultation bilaterally Heart: regular rate and rhythm, S1, S2 normal, no murmur, click, rub or gallop Abdomen: soft, non-tender; bowel sounds normal; no masses,  no organomegaly Extremities: extremities normal, atraumatic, no cyanosis or edema Pulses: 2+ and symmetric Skin: Skin color, texture, turgor normal. 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

## 2021-12-03 DIAGNOSIS — E119 Type 2 diabetes mellitus without complications: Secondary | ICD-10-CM | POA: Diagnosis not present

## 2022-01-22 DIAGNOSIS — E782 Mixed hyperlipidemia: Secondary | ICD-10-CM | POA: Diagnosis not present

## 2022-01-22 DIAGNOSIS — I10 Essential (primary) hypertension: Secondary | ICD-10-CM | POA: Diagnosis not present

## 2022-01-22 DIAGNOSIS — E1169 Type 2 diabetes mellitus with other specified complication: Secondary | ICD-10-CM | POA: Diagnosis not present

## 2022-01-22 DIAGNOSIS — E785 Hyperlipidemia, unspecified: Secondary | ICD-10-CM | POA: Diagnosis not present

## 2022-01-30 DIAGNOSIS — E782 Mixed hyperlipidemia: Secondary | ICD-10-CM | POA: Diagnosis not present

## 2022-01-30 DIAGNOSIS — E6609 Other obesity due to excess calories: Secondary | ICD-10-CM | POA: Diagnosis not present

## 2022-01-30 DIAGNOSIS — E1169 Type 2 diabetes mellitus with other specified complication: Secondary | ICD-10-CM | POA: Diagnosis not present

## 2022-01-30 DIAGNOSIS — I10 Essential (primary) hypertension: Secondary | ICD-10-CM | POA: Diagnosis not present

## 2023-01-16 DIAGNOSIS — M5451 Vertebrogenic low back pain: Secondary | ICD-10-CM | POA: Diagnosis not present

## 2023-07-09 ENCOUNTER — Other Ambulatory Visit: Payer: Self-pay | Admitting: Orthopedic Surgery

## 2023-07-09 DIAGNOSIS — M5416 Radiculopathy, lumbar region: Secondary | ICD-10-CM

## 2023-07-16 ENCOUNTER — Ambulatory Visit
Admission: RE | Admit: 2023-07-16 | Discharge: 2023-07-16 | Disposition: A | Discharge status: Home / Self Care | Payer: Medicaid Other | Source: Ambulatory Visit | Attending: Orthopedic Surgery | Admitting: Orthopedic Surgery

## 2023-07-16 DIAGNOSIS — M5416 Radiculopathy, lumbar region: Secondary | ICD-10-CM | POA: Insufficient documentation
